# Patient Record
Sex: Female | Born: 1970 | Race: White | Hispanic: No | Marital: Married | Smoking: Never smoker
Health system: Southern US, Community
[De-identification: ages and names within clinical notes are randomized; demographics above are authoritative.]

## PROBLEM LIST (undated history)

## (undated) ENCOUNTER — Emergency Department (HOSPITAL_COMMUNITY): Discharge status: Absent without leave | Payer: 59

## (undated) DIAGNOSIS — F32A Depression, unspecified: Secondary | ICD-10-CM

## (undated) DIAGNOSIS — N183 Chronic kidney disease, stage 3 unspecified: Secondary | ICD-10-CM

## (undated) DIAGNOSIS — K219 Gastro-esophageal reflux disease without esophagitis: Secondary | ICD-10-CM

## (undated) DIAGNOSIS — T4145XA Adverse effect of unspecified anesthetic, initial encounter: Secondary | ICD-10-CM

## (undated) DIAGNOSIS — F329 Major depressive disorder, single episode, unspecified: Secondary | ICD-10-CM

## (undated) DIAGNOSIS — J45909 Unspecified asthma, uncomplicated: Secondary | ICD-10-CM

## (undated) DIAGNOSIS — M40209 Unspecified kyphosis, site unspecified: Secondary | ICD-10-CM

## (undated) DIAGNOSIS — C439 Malignant melanoma of skin, unspecified: Secondary | ICD-10-CM

## (undated) DIAGNOSIS — Z8719 Personal history of other diseases of the digestive system: Secondary | ICD-10-CM

## (undated) DIAGNOSIS — G43909 Migraine, unspecified, not intractable, without status migrainosus: Secondary | ICD-10-CM

## (undated) DIAGNOSIS — F419 Anxiety disorder, unspecified: Secondary | ICD-10-CM

## (undated) DIAGNOSIS — R296 Repeated falls: Secondary | ICD-10-CM

## (undated) HISTORY — PX: CARPAL TUNNEL RELEASE: SHX101

## (undated) HISTORY — DX: Unspecified asthma, uncomplicated: J45.909

## (undated) HISTORY — DX: Chronic kidney disease, stage 3 unspecified: N18.30

## (undated) HISTORY — PX: TONSILLECTOMY: SUR1361

## (undated) HISTORY — DX: Unspecified kyphosis, site unspecified: M40.209

## (undated) HISTORY — DX: Migraine, unspecified, not intractable, without status migrainosus: G43.909

## (undated) HISTORY — DX: Repeated falls: R29.6

## (undated) HISTORY — PX: PARTIAL HYSTERECTOMY: SHX80

## (undated) HISTORY — PX: TUBAL LIGATION: SHX77

---

## 1997-12-02 ENCOUNTER — Inpatient Hospital Stay (HOSPITAL_COMMUNITY): Admission: AD | Admit: 1997-12-02 | Discharge: 1997-12-04 | Payer: Self-pay | Admitting: Obstetrics and Gynecology

## 1999-05-29 ENCOUNTER — Other Ambulatory Visit: Admission: RE | Admit: 1999-05-29 | Discharge: 1999-05-29 | Payer: Self-pay | Admitting: Obstetrics and Gynecology

## 2000-06-22 ENCOUNTER — Other Ambulatory Visit: Admission: RE | Admit: 2000-06-22 | Discharge: 2000-06-22 | Payer: Self-pay | Admitting: Obstetrics and Gynecology

## 2001-07-04 ENCOUNTER — Other Ambulatory Visit: Admission: RE | Admit: 2001-07-04 | Discharge: 2001-07-04 | Payer: Self-pay | Admitting: Obstetrics and Gynecology

## 2002-07-30 ENCOUNTER — Other Ambulatory Visit: Admission: RE | Admit: 2002-07-30 | Discharge: 2002-07-30 | Payer: Self-pay | Admitting: Obstetrics and Gynecology

## 2003-09-03 ENCOUNTER — Other Ambulatory Visit: Admission: RE | Admit: 2003-09-03 | Discharge: 2003-09-03 | Payer: Self-pay | Admitting: Obstetrics and Gynecology

## 2004-12-09 ENCOUNTER — Other Ambulatory Visit: Admission: RE | Admit: 2004-12-09 | Discharge: 2004-12-09 | Payer: Self-pay | Admitting: Obstetrics and Gynecology

## 2005-06-15 ENCOUNTER — Inpatient Hospital Stay (HOSPITAL_COMMUNITY): Admission: AD | Admit: 2005-06-15 | Discharge: 2005-06-15 | Payer: Self-pay | Admitting: Obstetrics and Gynecology

## 2005-07-28 ENCOUNTER — Observation Stay (HOSPITAL_COMMUNITY): Admission: AD | Admit: 2005-07-28 | Discharge: 2005-07-29 | Payer: Self-pay | Admitting: Obstetrics and Gynecology

## 2005-08-05 ENCOUNTER — Inpatient Hospital Stay (HOSPITAL_COMMUNITY): Admission: AD | Admit: 2005-08-05 | Discharge: 2005-08-07 | Payer: Self-pay | Admitting: Obstetrics and Gynecology

## 2005-08-06 ENCOUNTER — Encounter (INDEPENDENT_AMBULATORY_CARE_PROVIDER_SITE_OTHER): Payer: Self-pay | Admitting: Specialist

## 2005-09-13 ENCOUNTER — Other Ambulatory Visit: Admission: RE | Admit: 2005-09-13 | Discharge: 2005-09-13 | Payer: Self-pay | Admitting: Obstetrics and Gynecology

## 2007-10-02 ENCOUNTER — Emergency Department (HOSPITAL_COMMUNITY): Admission: EM | Admit: 2007-10-02 | Discharge: 2007-10-02 | Payer: Self-pay | Admitting: Family Medicine

## 2007-10-02 ENCOUNTER — Ambulatory Visit (HOSPITAL_COMMUNITY): Admission: RE | Admit: 2007-10-02 | Discharge: 2007-10-02 | Payer: Self-pay | Admitting: Family Medicine

## 2008-08-23 DIAGNOSIS — T8859XA Other complications of anesthesia, initial encounter: Secondary | ICD-10-CM

## 2008-08-23 DIAGNOSIS — C439 Malignant melanoma of skin, unspecified: Secondary | ICD-10-CM

## 2008-08-23 HISTORY — DX: Other complications of anesthesia, initial encounter: T88.59XA

## 2008-08-23 HISTORY — DX: Malignant melanoma of skin, unspecified: C43.9

## 2008-12-12 ENCOUNTER — Ambulatory Visit (HOSPITAL_COMMUNITY): Admission: RE | Admit: 2008-12-12 | Discharge: 2008-12-13 | Payer: Self-pay | Admitting: Obstetrics and Gynecology

## 2008-12-12 ENCOUNTER — Encounter (INDEPENDENT_AMBULATORY_CARE_PROVIDER_SITE_OTHER): Payer: Self-pay | Admitting: Obstetrics and Gynecology

## 2009-04-10 ENCOUNTER — Ambulatory Visit (HOSPITAL_COMMUNITY): Admission: RE | Admit: 2009-04-10 | Discharge: 2009-04-10 | Payer: Self-pay | Admitting: Family Medicine

## 2010-09-19 ENCOUNTER — Encounter
Admission: RE | Admit: 2010-09-19 | Discharge: 2010-09-19 | Payer: Self-pay | Source: Home / Self Care | Attending: Family Medicine | Admitting: Family Medicine

## 2010-10-12 ENCOUNTER — Other Ambulatory Visit: Payer: Self-pay | Admitting: Obstetrics and Gynecology

## 2010-12-02 LAB — TYPE AND SCREEN: Antibody Screen: NEGATIVE

## 2010-12-02 LAB — CBC
HCT: 36.9 % (ref 36.0–46.0)
Hemoglobin: 12.7 g/dL (ref 12.0–15.0)
Hemoglobin: 9.2 g/dL — ABNORMAL LOW (ref 12.0–15.0)
MCV: 85.9 fL (ref 78.0–100.0)
RBC: 3.1 MIL/uL — ABNORMAL LOW (ref 3.87–5.11)
RDW: 14.2 % (ref 11.5–15.5)
WBC: 6 10*3/uL (ref 4.0–10.5)
WBC: 7.5 10*3/uL (ref 4.0–10.5)

## 2011-01-05 NOTE — H&P (Signed)
NAME:  Dana [name], Dana [name]               ACCOUNT NO.:  [id number]   MEDICAL RECORD NO.:  [id number]          PATIENT TYPE:  AMB   LOCATION:  SDC                           FACILITY:  [facility]   PHYSICIAN:  Duke Salvia. Marcelle Overlie, M.D.DATE OF BIRTH:  02-13-1971   DATE OF ADMISSION:  DATE OF DISCHARGE:                              HISTORY & PHYSICAL   DATE OF SCHEDULED SURGERY:  December 12, 2008.   CHIEF COMPLAINT:  Persistent menorrhagia, status post endometrial  ablation.   HISTORY OF PRESENT ILLNESS:  A 40 year old G2, P1, prior tubal.  This  patient underwent ThermaChoice CMA, initially had some improvement but  continues to have significant menorrhagia, presents now for [facility].  This  procedure including risks related to bleeding, infection, adjacent organ  injury, the possible need to complete the surgery by open technique  reviewed, along with her expected recovery time, other risks related to  transfusion, wound infection, phlebitis discussed also.   PAST MEDICAL HISTORY:   ALLERGIES:  PENICILLIN.   CURRENT MEDICATIONS:  Wellbutrin 300 mg daily.  Cymbalta 30 mg daily.   OBSTETRICAL HISTORY:  One vaginal delivery in 1999.  She has had a tubal  ligation in 2006, carpal tunnel surgery in 2003.   FAMILY HISTORY:  Significant for peptic ulcer disease, anemia,  arthritis, and UTI.   SOCIAL HISTORY:  Denies drug or tobacco use.  She consumes 1-2 alcoholic  drinks per week.  She is married.   PHYSICAL EXAMINATION:  VITAL SIGNS:  Temp 98.2, blood pressure 118/78.  HEENT: Unremarkable.  NECK:  Supple without masses.  LUNGS:  Clear.  CARDIOVASCULAR:  Rate and rhythm without murmurs, rubs, or gallops.  BREASTS:  Without masses.  ABDOMEN:  Soft, flat, nontender.  Pelvic exam; normal external hair.  Vagina and cervix clear.  Uterus midposition, normal size, mobile.  Adnexa negative.  EXTREMITIES:  Unremarkable.  NEUROLOGIC:  Unremarkable.   IMPRESSION:  Persistent menorrhagia status post  ThermaChoice EMA.   PLAN:  TLH procedure and risks reviewed as above.      Richard M. Marcelle Overlie, M.D.  Electronically Signed     RMH/MEDQ  D:  12/06/2008  T:  12/07/2008  Job:  [id number]

## 2011-01-05 NOTE — Op Note (Signed)
NAME:  Dana [name], Dana [name]               ACCOUNT NO.:  [id number]   MEDICAL RECORD NO.:  [id number]          PATIENT TYPE:  OIB   LOCATION:  9303                          [facility]ACILITY:  [facility]   PHYSICIAN:  Duke Salvia. Marcelle Overlie, M.D.DATE OF BIRTH:  1971-04-16   DATE OF PROCEDURE:  12/12/2008  DATE OF DISCHARGE:                               OPERATIVE REPORT   PREOPERATIVE DIAGNOSES:  Persistent menorrhagia status post endometrial  ablation, possible adenomyosis.   PROCEDURE:  Total laparoscopic hysterectomy.   SURGEON:  Duke Salvia. Marcelle Overlie, MD   ANESTHESIA:  General endotracheal.   COMPLICATIONS:  None.   DRAINS:  Foley catheter.   BLOOD LOSS:  100 mL.   PROCEDURE AND FINDINGS:  The patient was taken to the operating room.  After an adequate level of general anesthesia was obtained and the  patient's legs in stirrups, the abdomen, perineum, and vagina were  prepped and draped in usual manner for laparoscopic procedures.  Foley  catheter positioned, draining clear urine.  A sponge stick was placed in  the vagina for later manipulation.  Attention directed to the abdomen  where the subumbilical area was infiltrated with 0.25% Marcaine plain.  A small incision was made, the Veress needle was introduced without  difficulty; its intra-abdominal position verified by pressure and water  testing.  After 2.5 L pneumoperitoneum was then created up, laparoscopic  trocar and sleeve were then introduced without difficulty.  The patient  was placed in slight Trendelenburg, 2 lower incisions, a 8-mm blade was  on the left and a 5 mm on the right, I replaced in the right and left  lower quadrant just medial to the iliac crest after negative  transillumination.  The pelvic findings as follows.   The uterus itself was normal size, mobile, adnexa unremarkable.  The  assistant putting the uterus on traction upwards into the midline.  The  left utero-ovarian pedicle was coagulated and divided with the  instill  instrument down to and including the round ligament.  The loose  peritoneal tissue anteriorly was carried around anteriorly, exact same  repeated on the opposite side.  Once this was completed with minimal  blunt dissection, the bladder was dissected below.  The ascending branch  of the uterine artery on each side was coagulated and divided with the  Enseal.  One additional pedicle on each side was then taken.  The  surgeon's hand was then used to elevate the vaginal sponge stick,  identifying the anterior fornix with sharp upper traction of the uterus  further minimal blunt dissection of the bladder below.  The Harmonic Ace  was then used to cut down to perform anterior colpotomy to the sponge  could be visualized.  A sponge was dispositioned with sharp upper  pressure post in the posterior fornix with the assistant putting the  uterus on traction upward to further identify the posterior fornix.  Once the site was located, harmonic was then used to perform posterior  colpotomy.  At that point, the Harmonic Ace was then used to dissect the  remaining pedicles by coagulation and division to connect  the anterior  and posterior colpotomy and the specimen was excised.  This was removed  vaginally with towel was used to occlude the vagina to prevent escape of  the pneumoperitoneum.  A 0 Vicryl sutures through-and-through with deep  bites were then placed interrupted closing the vagina with excellent  hemostasis.  This was approximately 5-6 sutures.  The operative site was  irrigated  with saline, aspirated, reduced pressure noted be hemostatic.  Interceed  was placed across the vaginal cuff, instruments were removed, gas  allowed to escape, defects closed with 4 Dexon subcuticular sutures and  Dermabond.  She tolerated this well.  Clear urine was noted at the end  of the case.      Richard M. Marcelle Overlie, M.D.  Electronically Signed     RMH/MEDQ  D:  12/12/2008  T:  12/12/2008   Job:  [id number]

## 2011-01-05 NOTE — Discharge Summary (Signed)
NAME:  Dana [name], Dana [name]               ACCOUNT NO.:  [id number]   MEDICAL RECORD NO.:  [id number]          PATIENT TYPE:  OIB   LOCATION:  9303                          [facility]ILITY:  [facility]   PHYSICIAN:  Duke Salvia. Marcelle Overlie, M.D.DATE OF BIRTH:  1970-11-11   DATE OF ADMISSION:  12/12/2008  DATE OF DISCHARGE:  12/13/2008                               DISCHARGE SUMMARY   DISCHARGE DIAGNOSES:  1. Persistent menorrhagia, status post endometrial ablation.  2. Total laparoscopic hysterectomy this admission.   Summary of the history and physical exam, please see admission H and P  for details.  Briefly, a 40 year old who has had a prior endometrial  ablation with persistent menorrhagia presents for TLH.   HOSPITAL COURSE:  On December 12, 2008, under general anesthesia, the  patient underwent TLH with an EBL of 100 mL.  On the first postoperative  day, the catheter was removed.  Her abdominal incisions were  unremarkable, afebrile, tolerating a regular diet, ready for discharge  at that point.  CBC postop is pending at this dictation, will be checked  before discharge.   LABORATORY DATA:  Blood type is O positive.  CBC preop, WBC 6.0,  hemoglobin 12.7, hematocrit 36.9, and platelets 234,000.  UPT was  negative.  Blood type is O positive.  Antibody screen was negative.  Postop CBC pending.   DISPOSITION:  The patient is discharged on Tylox 1-2 p.o. q.4-6 p.r.n.  pain, Motrin 800 mg 1 p.o. q.8-12 h. p.r.n. pain.  She will return to  the office in 1 week.  Advised to report any incisional redness or  drainage, increased pain or bleeding, or fever over 101.  She was given  specific instructions regarding diet, sex, and exercise.   CONDITION:  Good.   ACTIVITY:  Graded increase.      Richard M. Marcelle Overlie, M.D.  Electronically Signed     RMH/MEDQ  D:  12/13/2008  T:  12/14/2008  Job:  [id number]

## 2011-01-08 NOTE — Op Note (Signed)
Dana [name], Dana [name]               ACCOUNT NO.:  [id number]   MEDICAL RECORD NO.:  [id number]          PATIENT TYPE:  INP   LOCATION:  9146                          [facility]ILITY:  [facility]   PHYSICIAN:  Guy Sandifer. Henderson Cloud, M.D. DATE OF BIRTH:  1971/05/01   DATE OF PROCEDURE:  08/06/2005  DATE OF DISCHARGE:  08/07/2005                                 OPERATIVE REPORT   PREOPERATIVE DIAGNOSIS:  Multiparity, desires permanent sterilization.   POSTOPERATIVE DIAGNOSIS:  Multiparity, desires permanent sterilization.   PROCEDURE:  Postpartum tubal ligation.   SURGEON:  Harold Hedge, MD   ANESTHESIA:  Epidural.   ESTIMATED BLOOD LOSS:  Drops.   SPECIMENS:  Segments of bilateral fallopian tubes.   INDICATIONS AND CONSENT:  This patient is a 40 year old married white  female, G4, P2, status post vaginal delivery of a healthy child last  evening.  She desires permanent sterilization.  Permanence of the procedure,  failure rate, and increased ectopic risks as well as potential complications  of infection, bleeding with transfusion with HIV and hepatitis risks, organ  damage, DVT, PE, and pneumonia have been reviewed.  All questions have been  answered, and consent is signed on the chart.   PROCEDURE:  The patient is taken to operating room where she is identified;  epidural anesthetic is augmented to a surgical level, and she is placed in  the dorsosupine position.  She is prepped and draped in a sterile fashion.  Initially, she has a light pinching sensation when the skin is grasped with  Allis clamps.  Her epidural is augmented, and the infraumbilical area is  infiltrated with 0.5% plain Marcaine.  A small infraumbilical incision is  then made, and dissection is carried out in the peritoneal cavity without  difficulty.  The right fallopian tube is identified from cornu to fimbria.  It is grasped in its mid ampullary portion, and a knuckle of tube is doubly  ligated with 2 free ties of 0 plain  suture.  The intervening knuckle of tube  is then sharply resected.  Cautery is used to assure complete hemostasis.  Similar procedure is carried out on the left side.  After assuring  hemostasis, the fascia is closed in a running fashion with a 0 Vicryl  suture.  The skin is closed with a subcuticular 2-0 Vicryl suture.  Dermabond is applied; pressure dressing is applied.  All counts are correct.  The patient is taken to the recovery room in stable condition.      Guy Sandifer Henderson Cloud, M.D.  Electronically Signed    JET/MEDQ  D:  08/06/2005  T:  08/07/2005  Job:  [id number]

## 2011-05-14 LAB — POCT URINALYSIS DIP (DEVICE)
Bilirubin Urine: NEGATIVE
Glucose, UA: NEGATIVE
Ketones, ur: NEGATIVE
Nitrite: NEGATIVE

## 2011-05-14 LAB — POCT H PYLORI SCREEN: H. PYLORI SCREEN, POC: NEGATIVE

## 2011-05-14 LAB — POCT PREGNANCY, URINE
Operator id: 116391
Preg Test, Ur: NEGATIVE

## 2011-05-29 ENCOUNTER — Encounter: Payer: Self-pay | Admitting: *Deleted

## 2011-05-29 ENCOUNTER — Emergency Department (HOSPITAL_BASED_OUTPATIENT_CLINIC_OR_DEPARTMENT_OTHER)
Admission: EM | Admit: 2011-05-29 | Discharge: 2011-05-29 | Disposition: A | Discharge status: Home / Self Care | Payer: 59 | Attending: Emergency Medicine | Admitting: Emergency Medicine

## 2011-05-29 ENCOUNTER — Emergency Department (INDEPENDENT_AMBULATORY_CARE_PROVIDER_SITE_OTHER): Payer: 59

## 2011-05-29 DIAGNOSIS — M79609 Pain in unspecified limb: Secondary | ICD-10-CM

## 2011-05-29 DIAGNOSIS — W208XXA Other cause of strike by thrown, projected or falling object, initial encounter: Secondary | ICD-10-CM | POA: Insufficient documentation

## 2011-05-29 DIAGNOSIS — X58XXXA Exposure to other specified factors, initial encounter: Secondary | ICD-10-CM

## 2011-05-29 DIAGNOSIS — S9030XA Contusion of unspecified foot, initial encounter: Secondary | ICD-10-CM | POA: Insufficient documentation

## 2011-05-29 DIAGNOSIS — S8990XA Unspecified injury of unspecified lower leg, initial encounter: Secondary | ICD-10-CM

## 2011-05-29 HISTORY — DX: Depression, unspecified: F32.A

## 2011-05-29 HISTORY — DX: Major depressive disorder, single episode, unspecified: F32.9

## 2011-05-29 HISTORY — DX: Malignant melanoma of skin, unspecified: C43.9

## 2011-05-29 HISTORY — DX: Anxiety disorder, unspecified: F41.9

## 2011-05-29 NOTE — ED Notes (Signed)
Pt reports her 42yo son pulled a bookshelf on her foot - left foot bruised- painful to walk

## 2011-05-29 NOTE — ED Provider Notes (Signed)
History  Scribed for Dr. Judd Lien, the patient was seen in room [facility] The chart was scribed by Gilman Schmidt. The patients care was started at 2103.  CSN: [id number] Arrival date & time: 05/29/2011  8:31 PM  Chief Complaint  Patient presents with  . Foot Injury    HPI Dana [name] is a 40 y.o. female who presents to the Emergency Department complaining of left foot injury. Pt reports her 5yo son pulled a bookshelf on her foot today. States it is painful to walk. Additionally notes a tingling feeling in foot. There are no other associated symptoms and no other alleviating or aggravating factors.   PAST MEDICAL HISTORY:  Past Medical History  Diagnosis Date  . Melanoma   . Depression   . Anxiety      PAST SURGICAL HISTORY:  Past Surgical History  Procedure Date  . Carpal tunnel release   . Partial hysterectomy      MEDICATIONS:  Previous Medications   DESVENLAFAXINE (PRISTIQ) 50 MG 24 HR TABLET    Take 50 mg by mouth daily.       ALLERGIES:  Allergies as of 05/29/2011 - Review Complete 05/29/2011  Allergen Reaction Noted  . Penicillins Rash 05/29/2011     FAMILY HISTORY:  No family history on file.   SOCIAL HISTORY: History  Substance Use Topics  . Smoking status: Never Smoker   . Smokeless tobacco: Not on file  . Alcohol Use: Yes     rare     Review of Systems  Musculoskeletal:       Foot pain  Skin:       Bruising  All other systems reviewed and are negative.    Allergies  Penicillins  Home Medications   Current Outpatient Rx  Name Route Sig Dispense Refill  . DESVENLAFAXINE SUCCINATE 50 MG PO TB24 Oral Take 50 mg by mouth daily.        BP 116/70  Pulse 88  Temp(Src) 98.3 F (36.8 C) (Oral)  Resp 18  Ht 5\' 2"  (1.575 m)  Wt 134 lb (60.782 kg)  BMI 24.51 kg/m2  SpO2 100%  Physical Exam  Constitutional: She is oriented to person, place, and time. She appears well-developed and well-nourished.  HENT:  Head: Normocephalic and atraumatic.    Right Ear: External ear normal.  Left Ear: External ear normal.  Eyes: Conjunctivae and EOM are normal. Pupils are equal, round, and reactive to light.  Neck: Normal range of motion and phonation normal. Neck supple.  Cardiovascular: Normal rate, regular rhythm, normal heart sounds and intact distal pulses.   Pulmonary/Chest: Effort normal and breath sounds normal. She exhibits no bony tenderness.  Abdominal: Soft. Normal appearance. There is no tenderness.  Musculoskeletal: Normal range of motion.  Neurological: She is alert and oriented to person, place, and time. She has normal strength. No cranial nerve deficit or sensory deficit. She exhibits normal muscle tone. Coordination normal.  Skin: Skin is warm, dry and intact.       3-4cm ecchymosis around top of mid left foot  Psychiatric: She has a normal mood and affect. Her behavior is normal. Judgment and thought content normal.    ED Course  Procedures  OTHER DATA REVIEWED: Nursing notes, vital signs, and past medical records reviewed.   DIAGNOSTIC STUDIES: Oxygen Saturation is 100% on room air, normal by my interpretation.    RADIOLOGY:  Dg Foot Complete Left 05/29/2011  *RADIOLOGY REPORT*  Clinical Data: Foot injury, pain.  LEFT FOOT -  COMPLETE 3+ VIEW  Comparison: None  Findings: Degenerative changes in the left first MTP joint.  Soft tissue swelling over the dorsum of the foot.  No fracture, subluxation or dislocation.  IMPRESSION: No acute bony abnormality.  Original Report Authenticated By: Cyndie Chime, M.D.   MDM: Xrays okay, looks to be a bruise.  Will discharge with ice, time.  IMPRESSION: Diagnoses that have been ruled out:  Diagnoses that are still under consideration:  Final diagnoses:  Foot contusion    PLAN:  Home The patient is to return the emergency department if there is any worsening of symptoms. I have reviewed the discharge instructions with the patient.  CONDITION ON  DISCHARGE: Stable  MEDICATIONS GIVEN IN THE E.D.  Medications  desvenlafaxine (PRISTIQ) 50 MG 24 hr tablet (not administered)    DISCHARGE MEDICATIONS: New Prescriptions   No medications on file    SCRIBE ATTESTATION: I personally performed the services described in this documentation, which was scribed in my presence. The recorded information has been reviewed and considered.            Geoffery Lyons, MD 05/30/11 2035

## 2011-07-19 ENCOUNTER — Other Ambulatory Visit: Payer: Self-pay | Admitting: Dermatology

## 2012-01-03 ENCOUNTER — Other Ambulatory Visit: Payer: Self-pay | Admitting: Obstetrics and Gynecology

## 2012-01-03 DIAGNOSIS — R928 Other abnormal and inconclusive findings on diagnostic imaging of breast: Secondary | ICD-10-CM

## 2012-01-06 ENCOUNTER — Other Ambulatory Visit: Payer: Self-pay | Admitting: Obstetrics and Gynecology

## 2012-01-06 ENCOUNTER — Ambulatory Visit
Admission: RE | Admit: 2012-01-06 | Discharge: 2012-01-06 | Disposition: A | Discharge status: Home / Self Care | Payer: 59 | Source: Ambulatory Visit | Attending: Obstetrics and Gynecology | Admitting: Obstetrics and Gynecology

## 2012-01-06 DIAGNOSIS — R928 Other abnormal and inconclusive findings on diagnostic imaging of breast: Secondary | ICD-10-CM

## 2012-01-12 ENCOUNTER — Ambulatory Visit
Admission: RE | Admit: 2012-01-12 | Discharge: 2012-01-12 | Disposition: A | Discharge status: Home / Self Care | Payer: 59 | Source: Ambulatory Visit | Attending: Obstetrics and Gynecology | Admitting: Obstetrics and Gynecology

## 2012-01-12 ENCOUNTER — Other Ambulatory Visit: Payer: Self-pay | Admitting: Obstetrics and Gynecology

## 2012-01-12 DIAGNOSIS — R928 Other abnormal and inconclusive findings on diagnostic imaging of breast: Secondary | ICD-10-CM

## 2012-02-13 ENCOUNTER — Encounter (HOSPITAL_COMMUNITY): Payer: Self-pay | Admitting: Pharmacist

## 2012-02-14 NOTE — H&P (Signed)
NAMEMarland [name]  CARLING, LIBERMAN NO.:  [id number]  MEDICAL RECORD NO.:  [id number]  LOCATION:  PERIO                         [facility]ACILITY:  [facility]  PHYSICIAN:  Duke Salvia. Marcelle Overlie, M.D.DATE OF BIRTH:  12/16/1970  DATE OF ADMISSION:  02/03/2012 DATE OF DISCHARGE:                             HISTORY & PHYSICAL   CHIEF COMPLAINT:  Stress urinary incontinence.  HISTORY OF PRESENT ILLNESS:  A 41 year old G2, P2.  This patient has had a prior TLH.  In 2012, she had BAIN, but vaginal cuff biopsy showed no dysplasia.  She has had worsening urinary leakage problems and recently underwent urodynamics that showed PVR less than 45 mL with a normal UA. She did leak with a full bladder.  Further evaluation showed LPP 77 and 108 with MUCP 35 and 33 with a normal uroflow.  We discussed options for treatment and she prefers to proceed with outpatient single incision sling.  The procedure including risks related to bleeding, infection, adjacent organ injury, I reviewed the specific concerns regarding polypropylene mesh and the 3-5% risk of mesh erosion and how the complication is treated discussed with her.  The procedure including the anesthesia along with cystoscopy at that time of the procedure and followup afterwards reviewed with her also.  PAST MEDICAL HISTORY:  ALLERGIES:  PENICILLIN.  CURRENT MEDICATIONS:  Pristiq and Abilify.  OBSTETRICAL HISTORY:  Two vaginal deliveries.  Tubal ligation in 2006, TLH in 2007.  REVIEW OF SYSTEMS:  Significant for BAIN with subsequent negative biopsy, history of headache, peptic ulcer disease, UTI.  FAMILY HISTORY:  Significant for headache, hiatal hernia, anemia, diverticulosis, and arthritis.  SOCIAL HISTORY:  Denies tobacco or drug use.  She does drink socially. She is married.  Dr. Laurann Montana is her medical doctor.  PHYSICAL EXAMINATION:  VITAL SIGNS:  Temp 98.2, BP 120/70. HEENT:  Unremarkable. NECK:  Supple without masses. LUNGS:   Clear. CARDIOVASCULAR:  Regular rate and rhythm without murmurs, rubs, or gallops. BREASTS:  Without masses. ABDOMEN:  Soft, flat, nontender. PELVIC:  Normal external genitalia.  The vaginal cuff unremarkable. Bimanual negative. EXTREMITIES:  Unremarkable. NEUROLOGIC:  Unremarkable.  IMPRESSION:  Stress urinary incontinence.  PLAN:  Solyx single incision sling.  Procedure and risks discussed as above.     Mariafernanda Hendricksen M. Marcelle Overlie, M.D.     RMH/MEDQ  D:  02/14/2012  T:  02/14/2012  Job:  [id number]

## 2012-02-14 NOTE — Progress Notes (Signed)
ROSALYNN SERGENT  DICTATION # [id number] CSN# [id number]   Meriel Pica, MD 02/14/2012 3:01 PM

## 2012-02-16 ENCOUNTER — Encounter (HOSPITAL_COMMUNITY): Payer: Self-pay | Admitting: Pharmacy Technician

## 2012-02-18 ENCOUNTER — Encounter (HOSPITAL_COMMUNITY)
Admission: RE | Admit: 2012-02-18 | Discharge: 2012-02-18 | Disposition: A | Discharge status: Home / Self Care | Payer: 59 | Source: Ambulatory Visit | Attending: Obstetrics and Gynecology | Admitting: Obstetrics and Gynecology

## 2012-02-18 ENCOUNTER — Encounter (HOSPITAL_COMMUNITY): Payer: Self-pay

## 2012-02-18 HISTORY — DX: Adverse effect of unspecified anesthetic, initial encounter: T41.45XA

## 2012-02-18 HISTORY — DX: Gastro-esophageal reflux disease without esophagitis: K21.9

## 2012-02-18 HISTORY — DX: Personal history of other diseases of the digestive system: Z87.19

## 2012-02-18 LAB — CBC
HCT: 37.5 % (ref 36.0–46.0)
Hemoglobin: 13 g/dL (ref 12.0–15.0)
RBC: 4.31 MIL/uL (ref 3.87–5.11)
WBC: 6.7 10*3/uL (ref 4.0–10.5)

## 2012-02-18 NOTE — Patient Instructions (Addendum)
YOUR PROCEDURE IS SCHEDULED ON:02/25/12  ENTER THROUGH THE MAIN ENTRANCE OF [facility] AT:6am  USE DESK PHONE AND DIAL [phone] TO INFORM us OF YOUR ARRIVAL  CALL [phone] IF YOU HAVE ANY QUESTIONS OR PROBLEMS PRIOR TO YOUR ARRIVAL.  REMEMBER: DO NOT EAT OR DRINK AFTER MIDNIGHT :Thursday    YOU MAY BRUSH YOUR TEETH THE MORNING OF SURGERY   TAKE THESE MEDICINES THE DAY OF SURGERY WITH SIP OF WATER:none   DO NOT WEAR JEWELRY, EYE MAKEUP, LIPSTICK OR DARK FINGERNAIL POLISH DO NOT WEAR LOTIONS  DO NOT SHAVE FOR 48 HOURS PRIOR TO SURGERY  YOU WILL NOT BE ALLOWED TO DRIVE YOURSELF HOME.- Driver: Maurine Minister

## 2012-02-25 ENCOUNTER — Ambulatory Visit (HOSPITAL_COMMUNITY)
Admission: RE | Admit: 2012-02-25 | Discharge: 2012-02-25 | Disposition: A | Discharge status: Home / Self Care | Payer: 59 | Source: Ambulatory Visit | Attending: Obstetrics and Gynecology | Admitting: Obstetrics and Gynecology

## 2012-02-25 ENCOUNTER — Encounter (HOSPITAL_COMMUNITY): Payer: Self-pay | Admitting: Anesthesiology

## 2012-02-25 ENCOUNTER — Encounter (HOSPITAL_COMMUNITY)
Admission: RE | Disposition: A | Discharge status: Home / Self Care | Payer: Self-pay | Source: Ambulatory Visit | Attending: Obstetrics and Gynecology

## 2012-02-25 ENCOUNTER — Encounter (HOSPITAL_COMMUNITY): Payer: Self-pay

## 2012-02-25 ENCOUNTER — Ambulatory Visit (HOSPITAL_COMMUNITY): Payer: 59 | Admitting: Anesthesiology

## 2012-02-25 DIAGNOSIS — Z01812 Encounter for preprocedural laboratory examination: Secondary | ICD-10-CM | POA: Insufficient documentation

## 2012-02-25 DIAGNOSIS — Z01818 Encounter for other preprocedural examination: Secondary | ICD-10-CM | POA: Insufficient documentation

## 2012-02-25 DIAGNOSIS — N393 Stress incontinence (female) (male): Secondary | ICD-10-CM

## 2012-02-25 HISTORY — PX: CYSTOSCOPY: SHX5120

## 2012-02-25 HISTORY — PX: PUBOVAGINAL SLING: SHX1035

## 2012-02-25 SURGERY — CREATION, PUBOVAGINAL SLING
Anesthesia: General | Site: Vagina | Wound class: Clean Contaminated

## 2012-02-25 MED ORDER — PROPOFOL 10 MG/ML IV EMUL
INTRAVENOUS | Status: AC
  Filled 2012-02-25: qty 20

## 2012-02-25 MED ORDER — PANTOPRAZOLE SODIUM 40 MG PO TBEC
40.0000 mg | DELAYED_RELEASE_TABLET | Freq: Once | ORAL | Status: AC | PRN
  Administered 2012-02-25: 40 mg via ORAL

## 2012-02-25 MED ORDER — MIDAZOLAM HCL 5 MG/5ML IJ SOLN
INTRAMUSCULAR | Status: DC | PRN
  Administered 2012-02-25: 2 mg via INTRAVENOUS

## 2012-02-25 MED ORDER — STERILE WATER FOR IRRIGATION IR SOLN
Status: DC | PRN
  Administered 2012-02-25: 1000 mL via INTRAVESICAL

## 2012-02-25 MED ORDER — DEXAMETHASONE SODIUM PHOSPHATE 4 MG/ML IJ SOLN
INTRAMUSCULAR | Status: DC | PRN
  Administered 2012-02-25: 10 mg via INTRAVENOUS

## 2012-02-25 MED ORDER — KETOROLAC TROMETHAMINE 30 MG/ML IJ SOLN
INTRAMUSCULAR | Status: AC
  Filled 2012-02-25: qty 1

## 2012-02-25 MED ORDER — ONDANSETRON HCL 4 MG/2ML IJ SOLN
INTRAMUSCULAR | Status: AC
  Filled 2012-02-25: qty 2

## 2012-02-25 MED ORDER — KETOROLAC TROMETHAMINE 30 MG/ML IJ SOLN: 15.0000 mg | Freq: Once | INTRAMUSCULAR | Status: DC | PRN

## 2012-02-25 MED ORDER — MIDAZOLAM HCL 2 MG/2ML IJ SOLN
INTRAMUSCULAR | Status: AC
  Filled 2012-02-25: qty 2

## 2012-02-25 MED ORDER — LIDOCAINE-EPINEPHRINE 1 %-1:100000 IJ SOLN
INTRAMUSCULAR | Status: DC | PRN
  Administered 2012-02-25: 6 mL

## 2012-02-25 MED ORDER — GLYCOPYRROLATE 0.2 MG/ML IJ SOLN
INTRAMUSCULAR | Status: AC
  Filled 2012-02-25: qty 1

## 2012-02-25 MED ORDER — DEXAMETHASONE SODIUM PHOSPHATE 10 MG/ML IJ SOLN
INTRAMUSCULAR | Status: AC
  Filled 2012-02-25: qty 1

## 2012-02-25 MED ORDER — IBUPROFEN 200 MG PO TABS: 800.0000 mg | ORAL_TABLET | Freq: Three times a day (TID) | ORAL | Status: AC | PRN

## 2012-02-25 MED ORDER — PANTOPRAZOLE SODIUM 40 MG PO TBEC
DELAYED_RELEASE_TABLET | ORAL | Status: AC
  Filled 2012-02-25: qty 1

## 2012-02-25 MED ORDER — CIPROFLOXACIN IN D5W 400 MG/200ML IV SOLN
400.0000 mg | INTRAVENOUS | Status: AC
  Administered 2012-02-25: 400 mg via INTRAVENOUS
  Filled 2012-02-25: qty 200

## 2012-02-25 MED ORDER — ONDANSETRON HCL 4 MG/2ML IJ SOLN
INTRAMUSCULAR | Status: DC | PRN
  Administered 2012-02-25: 4 mg via INTRAVENOUS

## 2012-02-25 MED ORDER — PROPOFOL 10 MG/ML IV EMUL
INTRAVENOUS | Status: DC | PRN
  Administered 2012-02-25: 30 mg via INTRAVENOUS
  Administered 2012-02-25: 170 mg via INTRAVENOUS

## 2012-02-25 MED ORDER — LACTATED RINGERS IV SOLN
INTRAVENOUS | Status: DC
  Administered 2012-02-25: 125 mL/h via INTRAVENOUS
  Administered 2012-02-25: 08:00:00 via INTRAVENOUS

## 2012-02-25 MED ORDER — LIDOCAINE HCL (CARDIAC) 20 MG/ML IV SOLN
INTRAVENOUS | Status: AC
  Filled 2012-02-25: qty 5

## 2012-02-25 MED ORDER — CLINDAMYCIN PHOSPHATE 900 MG/50ML IV SOLN
900.0000 mg | INTRAVENOUS | Status: AC
  Administered 2012-02-25: 900 mg via INTRAVENOUS
  Filled 2012-02-25: qty 50

## 2012-02-25 MED ORDER — FENTANYL CITRATE 0.05 MG/ML IJ SOLN
INTRAMUSCULAR | Status: AC
  Filled 2012-02-25: qty 5

## 2012-02-25 MED ORDER — LIDOCAINE HCL (CARDIAC) 20 MG/ML IV SOLN
INTRAVENOUS | Status: DC | PRN
  Administered 2012-02-25: 50 mg via INTRAVENOUS

## 2012-02-25 MED ORDER — FENTANYL CITRATE 0.05 MG/ML IJ SOLN: 25.0000 ug | INTRAMUSCULAR | Status: DC | PRN

## 2012-02-25 MED ORDER — FENTANYL CITRATE 0.05 MG/ML IJ SOLN
INTRAMUSCULAR | Status: AC
  Filled 2012-02-25: qty 2

## 2012-02-25 MED ORDER — HYDROCODONE-IBUPROFEN 7.5-200 MG PO TABS: 1.0000 | ORAL_TABLET | Freq: Three times a day (TID) | ORAL | Status: AC | PRN

## 2012-02-25 MED ORDER — GLYCOPYRROLATE 0.2 MG/ML IJ SOLN
INTRAMUSCULAR | Status: DC | PRN
  Administered 2012-02-25: 0.3 mg via INTRAVENOUS

## 2012-02-25 MED ORDER — LACTATED RINGERS IV SOLN: INTRAVENOUS | Status: DC

## 2012-02-25 MED ORDER — FENTANYL CITRATE 0.05 MG/ML IJ SOLN
INTRAMUSCULAR | Status: DC | PRN
  Administered 2012-02-25 (×3): 50 ug via INTRAVENOUS
  Administered 2012-02-25: 100 ug via INTRAVENOUS

## 2012-02-25 MED ORDER — PANTOPRAZOLE SODIUM 40 MG PO TBEC
DELAYED_RELEASE_TABLET | ORAL | Status: AC
  Administered 2012-02-25: 40 mg via ORAL
  Filled 2012-02-25: qty 1

## 2012-02-25 MED ORDER — KETOROLAC TROMETHAMINE 15 MG/ML IJ SOLN
INTRAMUSCULAR | Status: DC | PRN
  Administered 2012-02-25: 30 mg via INTRAVENOUS

## 2012-02-25 SURGICAL SUPPLY — 25 items
ADH SKN CLS APL DERMABOND .7 (GAUZE/BANDAGES/DRESSINGS) ×2
BLADE SURG 11 STRL SS (BLADE) ×3 IMPLANT
BLADE SURG 15 STRL LF C SS BP (BLADE) ×2 IMPLANT
BLADE SURG 15 STRL SS (BLADE) ×3
CANISTER SUCTION 2500CC (MISCELLANEOUS) ×3 IMPLANT
CLOTH BEACON ORANGE TIMEOUT ST (SAFETY) ×3 IMPLANT
DECANTER SPIKE VIAL GLASS SM (MISCELLANEOUS) ×5 IMPLANT
DERMABOND ADVANCED (GAUZE/BANDAGES/DRESSINGS) ×1
DERMABOND ADVANCED .7 DNX12 (GAUZE/BANDAGES/DRESSINGS) ×2 IMPLANT
GLOVE BIO SURGEON STRL SZ7 (GLOVE) ×6 IMPLANT
GOWN PREVENTION PLUS LG XLONG (DISPOSABLE) ×10 IMPLANT
NDL HYPO 25X1 1.5 SAFETY (NEEDLE) ×2 IMPLANT
NEEDLE HYPO 25X1 1.5 SAFETY (NEEDLE) ×3 IMPLANT
NS IRRIG 1000ML POUR BTL (IV SOLUTION) ×3 IMPLANT
PACK VAGINAL WOMENS (CUSTOM PROCEDURE TRAY) ×3 IMPLANT
SET CYSTO W/LG BORE CLAMP LF (SET/KITS/TRAYS/PACK) ×3 IMPLANT
SUT VIC AB 2-0 CT1 27 (SUTURE) ×3
SUT VIC AB 2-0 CT1 TAPERPNT 27 (SUTURE) IMPLANT
SUT VIC AB 2-0 SH 27 (SUTURE) ×3
SUT VIC AB 2-0 SH 27XBRD (SUTURE) IMPLANT
SUT VIC AB 2-0 UR6 27 (SUTURE) ×1 IMPLANT
Solyx SIS System (Sling) ×1 IMPLANT
TOWEL OR 17X24 6PK STRL BLUE (TOWEL DISPOSABLE) ×6 IMPLANT
TRAY FOLEY CATH 14FR (SET/KITS/TRAYS/PACK) ×3 IMPLANT
WATER STERILE IRR 1000ML POUR (IV SOLUTION) ×3 IMPLANT

## 2012-02-25 NOTE — Progress Notes (Signed)
The patient was re-examined with no change in status 

## 2012-02-25 NOTE — Op Note (Signed)
Preoperative diagnosis: Stress urinary incontinence  Postoperative diagnosis: Same  Surgeon: Marcelle Overlie  EBL: 100 cc  Specimens removed: None  Drains: Foley catheter  Complications: None  Procedure and findings:  The patient taken the operating room after an adequate level of general anesthesia was obtained with the legs in stirrups the perineum and vagina were prepped and draped with Betadine and the bladder was drained with a red rubber catheter. Appropriate timeout for taken at that point.  The mid urethral area was grasped between 2 Allis clamps, dilute Xylocaine with epinephrine was injected a vertical incision was made at the mid urethral area approximately 2 cm, finger dissection was then used to dissect this along with Metzenbaum scissors. This was done until the posterior aspect of the inferior pubic ramus could be palpated on each side. Once this was accomplished the Solix sling was first anchored at a 45 angle on the patient's right to the midpoint per protocol. Once this was Berneice Heinrich the mesh was reloaded appropriate position on the left with tensioning such that the mesh was lying flat at the mid urethral area without significant tension. After this was accomplished cystoscopy was carried out revealing the bladder and urethra to be normal. Foley catheter was in position draining clear urine. The vaginal mucosal incision was closed with interrupted 2-0 Vicryl sutures. This was hemostatic she went to PACU in good condition.  Dictated with [facility]  Genette Huertas M. Milana Obey.D.

## 2012-02-25 NOTE — Anesthesia Postprocedure Evaluation (Signed)
Anesthesia Post Note  Patient: Dana [name]  Procedure(s) Performed: Procedure(s) (LRB): PUBO-VAGINAL SLING (N/A) CYSTOSCOPY (N/A)  Anesthesia type: General  Patient location: PACU  Post pain: Pain level controlled  Post assessment: Post-op Vital signs reviewed  Last Vitals:  Filed Vitals:   02/25/12 0845  BP: 121/69  Pulse: 129  Temp:   Resp: 12    Post vital signs: Reviewed  Level of consciousness: sedated  Complications: No apparent anesthesia complications

## 2012-02-25 NOTE — Anesthesia Preprocedure Evaluation (Signed)
Anesthesia Evaluation  Patient identified by MRN, date of birth, ID band Patient awake    Reviewed: Allergy & Precautions, H&P , NPO status , Patient's Chart, lab work & pertinent test results, reviewed documented beta blocker date and time   History of Anesthesia Complications Negative for: history of anesthetic complications  Airway Mallampati: I TM Distance: >3 FB Neck ROM: full    Dental  (+) Teeth Intact   Pulmonary neg pulmonary ROS,  breath sounds clear to auscultation  Pulmonary exam normal       Cardiovascular Exercise Tolerance: Good negative cardio ROS  Rhythm:regular Rate:Normal     Neuro/Psych PSYCHIATRIC DISORDERS (anxiety/depression) negative neurological ROS     GI/Hepatic Neg liver ROS, hiatal hernia, GERD- (no meds)  ,  Endo/Other  negative endocrine ROS  Renal/GU negative Renal ROS Bladder dysfunction      Musculoskeletal   Abdominal   Peds  Hematology negative hematology ROS (+)   Anesthesia Other Findings   Reproductive/Obstetrics negative OB ROS                           Anesthesia Physical Anesthesia Plan  ASA: II  Anesthesia Plan: General LMA   Post-op Pain Management:    Induction:   Airway Management Planned:   Additional Equipment:   Intra-op Plan:   Post-operative Plan:   Informed Consent: I have reviewed the patients History and Physical, chart, labs and discussed the procedure including the risks, benefits and alternatives for the proposed anesthesia with the patient or authorized representative who has indicated his/her understanding and acceptance.   Dental Advisory Given  Plan Discussed with: CRNA and Surgeon  Anesthesia Plan Comments:         Anesthesia Quick Evaluation

## 2012-02-25 NOTE — Anesthesia Procedure Notes (Signed)
Procedure Name: LMA Insertion Date/Time: 02/25/2012 7:20 AM Performed by: Isabella Bowens R Pre-anesthesia Checklist: Patient identified, Emergency Drugs available, Suction available, Timeout performed and Patient being monitored Patient Re-evaluated:Patient Re-evaluated prior to inductionOxygen Delivery Method: Circle system utilized Preoxygenation: Pre-oxygenation with 100% oxygen Intubation Type: IV induction Ventilation: Mask ventilation without difficulty LMA: LMA inserted LMA Size: 4.0 Grade View: Grade I Number of attempts: 1 Placement Confirmation: positive ETCO2 and breath sounds checked- equal and bilateral Dental Injury: Teeth and Oropharynx as per pre-operative assessment

## 2012-02-25 NOTE — Transfer of Care (Signed)
Immediate Anesthesia Transfer of Care Note  Patient: Dana [name]  Procedure(s) Performed: Procedure(s) (LRB): PUBO-VAGINAL SLING (N/A) CYSTOSCOPY (N/A)  Patient Location: PACU  Anesthesia Type: General  Level of Consciousness: awake, alert  and oriented  Airway & Oxygen Therapy: Patient Spontanous Breathing and Patient connected to nasal cannula oxygen  Post-op Assessment: Report given to PACU RN and Post -op Vital signs reviewed and stable  Post vital signs: Reviewed and stable  Complications: No apparent anesthesia complications

## 2012-02-27 ENCOUNTER — Encounter (HOSPITAL_COMMUNITY): Payer: Self-pay | Admitting: Obstetrics and Gynecology

## 2014-07-24 ENCOUNTER — Other Ambulatory Visit: Payer: Self-pay | Admitting: Obstetrics and Gynecology

## 2014-07-25 LAB — CYTOLOGY - PAP

## 2015-12-04 ENCOUNTER — Emergency Department (HOSPITAL_BASED_OUTPATIENT_CLINIC_OR_DEPARTMENT_OTHER): Payer: 59

## 2015-12-04 ENCOUNTER — Emergency Department (HOSPITAL_BASED_OUTPATIENT_CLINIC_OR_DEPARTMENT_OTHER)
Admission: EM | Admit: 2015-12-04 | Discharge: 2015-12-05 | Disposition: A | Discharge status: Home / Self Care | Payer: 59 | Attending: Emergency Medicine | Admitting: Emergency Medicine

## 2015-12-04 ENCOUNTER — Encounter (HOSPITAL_BASED_OUTPATIENT_CLINIC_OR_DEPARTMENT_OTHER): Payer: Self-pay

## 2015-12-04 DIAGNOSIS — Y999 Unspecified external cause status: Secondary | ICD-10-CM | POA: Diagnosis not present

## 2015-12-04 DIAGNOSIS — Z8553 Personal history of malignant neoplasm of renal pelvis: Secondary | ICD-10-CM | POA: Insufficient documentation

## 2015-12-04 DIAGNOSIS — S93401A Sprain of unspecified ligament of right ankle, initial encounter: Secondary | ICD-10-CM | POA: Insufficient documentation

## 2015-12-04 DIAGNOSIS — F329 Major depressive disorder, single episode, unspecified: Secondary | ICD-10-CM | POA: Diagnosis not present

## 2015-12-04 DIAGNOSIS — Y939 Activity, unspecified: Secondary | ICD-10-CM | POA: Diagnosis not present

## 2015-12-04 DIAGNOSIS — W109XXA Fall (on) (from) unspecified stairs and steps, initial encounter: Secondary | ICD-10-CM | POA: Insufficient documentation

## 2015-12-04 DIAGNOSIS — Y929 Unspecified place or not applicable: Secondary | ICD-10-CM | POA: Insufficient documentation

## 2015-12-04 DIAGNOSIS — S99911A Unspecified injury of right ankle, initial encounter: Secondary | ICD-10-CM | POA: Diagnosis present

## 2015-12-04 MED ORDER — NAPROXEN 500 MG PO TABS: 500.0000 mg | ORAL_TABLET | Freq: Two times a day (BID) | ORAL | Status: DC

## 2015-12-04 MED ORDER — IBUPROFEN 800 MG PO TABS
800.0000 mg | ORAL_TABLET | Freq: Once | ORAL | Status: AC
  Administered 2015-12-04: 800 mg via ORAL
  Filled 2015-12-04: qty 1

## 2015-12-04 NOTE — ED Notes (Signed)
Pt fell down approximately three steps and rolled right ankle, swelling and pain to area, unable to bear weight

## 2015-12-04 NOTE — ED Notes (Signed)
PA at bedside.

## 2015-12-04 NOTE — ED Provider Notes (Signed)
CSN: [id number]     Arrival date & time 12/04/15  2140 History   First MD Initiated Contact with Patient 12/04/15 2321     Chief Complaint  Patient presents with  . Ankle Injury     (Consider location/radiation/quality/duration/timing/severity/associated sxs/prior Treatment) HPI Comments: Patient presents today with right ankle pain.  Pain has been present since she tripped and fell down three stairs approximately one hour prior to arrival.  She states that she twisted her right ankle when she fell and then landed on her left side.  She denies hitting her head or LOC.  Denies neck pain or back pain.  Denies knee or hip pain.  Denies any pain of the LLE.  No numbness or tingling of the foot.  Ankle pain and swelling located over the lateral malleolus.  She has not taken anything for pain prior to arrival.  She was given Ibuprofen for the pain upon arrival in the ED, which she states has helped.  She has not been able to bear weight since the injury.  The history is provided by the patient.    Past Medical History  Diagnosis Date  . Depression   . Anxiety   . Melanoma ([place]) 2010    left hip  . GERD (gastroesophageal reflux disease)     no meds  . H/O hiatal hernia   . Complication of anesthesia 2010    hard to wake up after colonoscopy, and increased HR   Past Surgical History  Procedure Laterality Date  . Carpal tunnel release    . Partial hysterectomy    . Pubovaginal sling  02/25/2012    Procedure: Gaynelle Arabian;  Surgeon: Margarette Asal, MD;  Location: [facility];  Service: Gynecology;  Laterality: N/A;  . Cystoscopy  02/25/2012    Procedure: CYSTOSCOPY;  Surgeon: Margarette Asal, MD;  Location: [facility];  Service: Gynecology;  Laterality: N/A;   No family history on file. Social History  Substance Use Topics  . Smoking status: Never Smoker   . Smokeless tobacco: None  . Alcohol Use: Yes     Comment: rare   OB History    No data available     Review of Systems  All  other systems reviewed and are negative.     Allergies  Penicillins  Home Medications   Prior to Admission medications   Medication Sig Start Date End Date Taking? Authorizing Provider  ARIPiprazole (ABILIFY) 2 MG tablet Take 2 mg by mouth daily.    Historical Provider, MD  Cetirizine HCl (ZYRTEC ALLERGY PO) Take 1 tablet by mouth daily as needed. For allergy.    Historical Provider, MD  desvenlafaxine (PRISTIQ) 50 MG 24 hr tablet Take 50 mg by mouth daily.      Historical Provider, MD   BP 112/75 mmHg  Pulse 78  Temp(Src) 98.3 F (36.8 C) (Oral)  Resp 16  Ht 5\' 2"  (1.575 m)  Wt 71.668 kg  BMI 28.89 kg/m2  SpO2 98% Physical Exam  Constitutional: She appears well-developed and well-nourished.  HENT:  Head: Normocephalic and atraumatic.  Neck: Normal range of motion. Neck supple.  Cardiovascular: Normal rate, regular rhythm and normal heart sounds.   Pulses:      Dorsalis pedis pulses are 2+ on the right side.  Pulmonary/Chest: Effort normal and breath sounds normal.  Musculoskeletal: Normal range of motion.       Right hip: She exhibits normal range of motion and no tenderness.  Right knee: She exhibits normal range of motion. No tenderness found.       Right ankle: She exhibits swelling. She exhibits normal range of motion, no ecchymosis and no deformity. Tenderness. Lateral malleolus tenderness found.       Cervical back: She exhibits normal range of motion, no tenderness, no bony tenderness, no swelling, no edema and no deformity.       Thoracic back: She exhibits normal range of motion, no tenderness, no bony tenderness, no swelling, no edema and no deformity.       Lumbar back: She exhibits normal range of motion, no tenderness, no bony tenderness, no swelling, no edema and no deformity.  Tenderness and swelling of the right lateral malleolus  Neurological: She is alert.  Distal sensation of right foot intact  Skin: Skin is warm and dry.  Psychiatric: She has a  normal mood and affect.  Nursing note and vitals reviewed.   ED Course  Procedures (including critical care time) Labs Review Labs Reviewed - No data to display  Imaging Review Dg Ankle Complete Right  12/04/2015  CLINICAL DATA:  Pain following fall down stairs EXAM: RIGHT ANKLE - COMPLETE 3+ VIEW COMPARISON:  None. FINDINGS: Frontal, oblique, and lateral views obtained. There is soft tissue swelling. There is evidence of old trauma in the lateral malleolar region with well corticated ossified area in the lateral malleolar region inferiorly. No acute fracture is evident. There is no appreciable joint effusion. The ankle mortise appears intact. There is a minimal posterior calcaneal spur. IMPRESSION: Evidence of old trauma lateral malleolus. No acute fracture. Ankle mortise appears intact. Minimal posterior calcaneal spur. There is soft tissue swelling, primarily laterally. Electronically Signed   By: Lowella Grip III M.D.   On: 12/04/2015 22:26   Dg Foot Complete Right  12/04/2015  CLINICAL DATA:  Pain following fall down stairs EXAM: RIGHT FOOT COMPLETE - 3+ VIEW COMPARISON:  None. FINDINGS: Frontal, oblique, and lateral views were obtained. There is no fracture or dislocation. The joint spaces appear normal. There is mild bunion formation medial to the first distal metatarsal. Calcification inferior to the lateral malleolus most likely represents residua of prior trauma. IMPRESSION: There is a degree of bunion formation medial to the first metatarsal distally. No fracture or dislocation. No appreciable arthropathy. Electronically Signed   By: Lowella Grip III M.D.   On: 12/04/2015 22:27   I have personally reviewed and evaluated these images and lab results as part of my medical decision-making.   EKG Interpretation None      MDM   Final diagnoses:  None   Patient presents today with right ankle pain that has been present since twisting her ankle and falling earlier this  evening.  Xray of right foot and ankle negative for acute findings.  Neurovascularly intact.  Patient given Ankle ASO and crutches.  RICE instructions given.  Stable for discharge.  Return precautions given.    Hyman Bible, PA-C 12/05/15 [place], DO 12/08/15 [phone]

## 2017-04-11 ENCOUNTER — Ambulatory Visit
Admission: RE | Admit: 2017-04-11 | Discharge: 2017-04-11 | Disposition: A | Discharge status: Home / Self Care | Payer: 59 | Source: Ambulatory Visit | Attending: Family Medicine | Admitting: Family Medicine

## 2017-04-11 ENCOUNTER — Other Ambulatory Visit: Payer: Self-pay | Admitting: Family Medicine

## 2017-04-11 DIAGNOSIS — R0609 Other forms of dyspnea: Principal | ICD-10-CM

## 2017-04-18 ENCOUNTER — Telehealth: Payer: Self-pay

## 2017-04-18 NOTE — Telephone Encounter (Signed)
SENT NOTES TO SCHEDULING 

## 2017-05-09 ENCOUNTER — Ambulatory Visit: Payer: 59 | Admitting: Cardiology

## 2017-05-16 NOTE — Progress Notes (Signed)
Referring-Dana [name] M.D. Reason for referral-dyspnea  HPI: 46 year old female for evaluation of dyspnea at request of Harlan Stains M.D. Chest x-ray August 2018 showed no active cardiopulmonary disease. Laboratories August 2018 showed total cholesterol 218 with LDL 115, TSH 1.57, BUN 18 hemoglobin 13.8. Patient states that she has put on approximately 40 pounds over the past several years. She has noticed gradual increase dyspnea on exertion. There is no orthopnea, PND, pedal edema, palpitations or syncope. [place] and as she was walking up a steep pill she became extremely short of breath and noticed some chest tightness. She otherwise has not had exertional chest pain. Because of the above cardiology was asked to evaluate. Note she has had occasional chest pain at rest in the past that she attributes to reflux. It does not radiate and there are no associated symptoms. Also of note she does have some improvement in her dyspnea on exertion if she uses Proventil inhaler prior to exercise.   Current Outpatient Prescriptions  Medication Sig Dispense Refill  . Cariprazine HCl (VRAYLAR) 1.5 MG CAPS Take 1.5 mg by mouth daily.    . Cetirizine HCl (ZYRTEC ALLERGY PO) Take 1 tablet by mouth daily as needed. For allergy.    Marland Kitchen desvenlafaxine (PRISTIQ) 50 MG 24 hr tablet Take 50 mg by mouth daily.      Marland Kitchen lamoTRIgine (LAMICTAL) 100 MG tablet Take 100 mg by mouth daily.    . naproxen (NAPROSYN) 500 MG tablet Take 1 tablet (500 mg total) by mouth 2 (two) times daily. 30 tablet 0   No current facility-administered medications for this visit.     Allergies  Allergen Reactions  . Penicillins Rash     Past Medical History:  Diagnosis Date  . Anxiety   . Asthma   . Complication of anesthesia 2010   hard to wake up after colonoscopy, and increased HR  . Depression   . GERD (gastroesophageal reflux disease)    no meds  . H/O hiatal hernia   . Melanoma ([place]) 2010   left  hip    Past Surgical History:  Procedure Laterality Date  . CARPAL TUNNEL RELEASE    . CYSTOSCOPY  02/25/2012   Procedure: CYSTOSCOPY;  Surgeon: Margarette Asal, MD;  Location: [facility];  Service: Gynecology;  Laterality: N/A;  . PARTIAL HYSTERECTOMY    . PUBOVAGINAL SLING  02/25/2012   Procedure: Gaynelle Arabian;  Surgeon: Margarette Asal, MD;  Location: [facility];  Service: Gynecology;  Laterality: N/A;  . TONSILLECTOMY      Social History   Social History  . Marital status: Married    Spouse name: N/A  . Number of children: 2  . Years of education: N/A   Occupational History  .      Human relations   Social History Main Topics  . Smoking status: Never Smoker  . Smokeless tobacco: Never Used  . Alcohol use Yes     Comment: rare  . Drug use: No  . Sexual activity: Not on file   Other Topics Concern  . Not on file   Social History Narrative  . No narrative on file    Family History  Problem Relation Age of Onset  . Pancreatic cancer Mother   . Hyperlipidemia Mother   . Dementia Father     ROS: no fevers or chills, productive cough, hemoptysis, dysphasia, odynophagia, melena, hematochezia, dysuria, hematuria, rash, seizure activity, orthopnea, PND, pedal edema, claudication. Remaining systems  are negative.  Physical Exam:   Blood pressure 110/72, pulse 83, height 5\' 2"  (1.575 m), weight 75.8 kg (167 lb).  General:  Well developed/well nourished in NAD Skin warm/dry Patient not depressed No peripheral clubbing Back-normal HEENT-normal/normal eyelids Neck supple/normal carotid upstroke bilaterally; no bruits; no JVD; no thyromegaly chest - CTA/ normal expansion CV - RRR/normal S1 and S2; no murmurs, rubs or gallops;  PMI nondisplaced Abdomen -NT/ND, no HSM, no mass, + bowel sounds, no bruit 2+ femoral pulses, no bruits Ext-no edema, chords, 2+ DP Neuro-grossly nonfocal  ECG - normal sinus rhythm, cannot rule out prior septal infarct. personally  reviewed  A/P  1 dyspnea- patient presents with complaints of increasing dyspnea on exertion. Etiology unclear. Possible contribution from asthma as her Proventil inhaler improves her symptoms if she uses prior to exercise. She also states she has put on 40 pounds and there may be a component of deconditioning. I will arrange an exercise treadmill to screen for coronary disease and risk stratify. Schedule echocardiogram to assess LV systolic and diastolic function. If normal I have asked her to increase her activities/exercise and we discussed weight loss.  2 Chest tightness-she had vague chest tightness with her [place]. As outlined we will arrange an exercise treadmill for risk stratification.  3 Possible asthma-management per primary care.   Kirk Ruths, MD

## 2017-05-23 ENCOUNTER — Telehealth: Payer: Self-pay | Admitting: Cardiology

## 2017-05-23 NOTE — Telephone Encounter (Signed)
Received records from [facility] for appointment on 05/25/17 with Dr Stanford Breed.  Records put with Dr Jacalyn Lefevre schedule for 05/25/17. lp

## 2017-05-25 ENCOUNTER — Encounter: Payer: Self-pay | Admitting: Cardiology

## 2017-05-25 ENCOUNTER — Ambulatory Visit (INDEPENDENT_AMBULATORY_CARE_PROVIDER_SITE_OTHER): Payer: 59 | Admitting: Cardiology

## 2017-05-25 ENCOUNTER — Ambulatory Visit: Payer: 59 | Admitting: Cardiology

## 2017-05-25 VITALS — BP 110/72 | HR 83 | Ht 62.0 in | Wt 167.0 lb

## 2017-05-25 DIAGNOSIS — R0609 Other forms of dyspnea: Secondary | ICD-10-CM | POA: Diagnosis not present

## 2017-05-25 DIAGNOSIS — R072 Precordial pain: Secondary | ICD-10-CM | POA: Diagnosis not present

## 2017-05-25 NOTE — Patient Instructions (Addendum)
Medication Instructions:   NO CHANGE  Testing/Procedures:  Your physician has requested that you have an exercise tolerance test. For further information please visit [email or web address]. Please also follow instruction sheet, as given.   Your physician has requested that you have an echocardiogram. Echocardiography is a painless test that uses sound waves to create images of your heart. It provides your doctor with information about the size and shape of your heart and how well your heart's chambers and valves are working. This procedure takes approximately one hour. There are no restrictions for this procedure.    Follow-Up:  Your physician recommends that you schedule a follow-up appointment in: AS NEEDED PENDING TEST RESULTS     Exercise Stress Electrocardiogram An exercise stress electrocardiogram is a test to check how blood flows to your heart. It is done to find areas of poor blood flow. You will need to walk on a treadmill for this test. The electrocardiogram will record your heartbeat when you are at rest and when you are exercising. What happens before the procedure?  Do not have drinks with caffeine or foods with caffeine for 24 hours before the test, or as told by your doctor. This includes coffee, tea (even decaf tea), sodas, chocolate, and cocoa.  Follow your doctor's instructions about eating and drinking before the test.  Ask your doctor what medicines you should or should not take before the test. Take your medicines with water unless told by your doctor not to.  If you use an inhaler, bring it with you to the test.  Bring a snack to eat after the test.  Do not  smoke for 4 hours before the test.  Do not put lotions, powders, creams, or oils on your chest before the test.  Wear comfortable shoes and clothing. What happens during the procedure?  You will have patches put on your chest. Small areas of your chest may need to be shaved. Wires will be connected to the  patches.  Your heart rate will be watched while you are resting and while you are exercising.  You will walk on the treadmill. The treadmill will slowly get faster to raise your heart rate.  The test will take about 1-2 hours. What happens after the procedure?  Your heart rate and blood pressure will be watched after the test.  You may return to your normal diet, activities, and medicines or as told by your doctor. This information is not intended to replace advice given to you by your health care provider. Make sure you discuss any questions you have with your health care provider. Document Released: 01/26/2008 Document Revised: 04/07/2016 Document Reviewed: 04/16/2013 Elsevier Interactive Patient Education  [employer].

## 2017-06-07 ENCOUNTER — Other Ambulatory Visit: Payer: Self-pay

## 2017-06-07 ENCOUNTER — Ambulatory Visit (INDEPENDENT_AMBULATORY_CARE_PROVIDER_SITE_OTHER): Payer: 59

## 2017-06-07 ENCOUNTER — Ambulatory Visit (HOSPITAL_COMMUNITY): Payer: 59 | Attending: Cardiovascular Disease

## 2017-06-07 DIAGNOSIS — F419 Anxiety disorder, unspecified: Secondary | ICD-10-CM | POA: Insufficient documentation

## 2017-06-07 DIAGNOSIS — J45909 Unspecified asthma, uncomplicated: Secondary | ICD-10-CM | POA: Diagnosis not present

## 2017-06-07 DIAGNOSIS — R0609 Other forms of dyspnea: Secondary | ICD-10-CM

## 2017-06-07 LAB — EXERCISE TOLERANCE TEST
CHL CUP RESTING HR STRESS: 86 {beats}/min
CHL RATE OF PERCEIVED EXERTION: 15
CSEPEDS: 32 s
CSEPHR: 96 %
Estimated workload: 9.3 METS
Exercise duration (min): 7 min
MPHR: 172 {beats}/min
Peak HR: 166 {beats}/min

## 2017-10-21 IMAGING — CR DG ANKLE COMPLETE 3+V*R*
3 series · 3 of 3 positions shown · non-contrast
Comparison: None.

CLINICAL DATA: Pain following fall down stairs

EXAM:
RIGHT ANKLE - COMPLETE 3+ VIEW

[t ankle joint ap right]
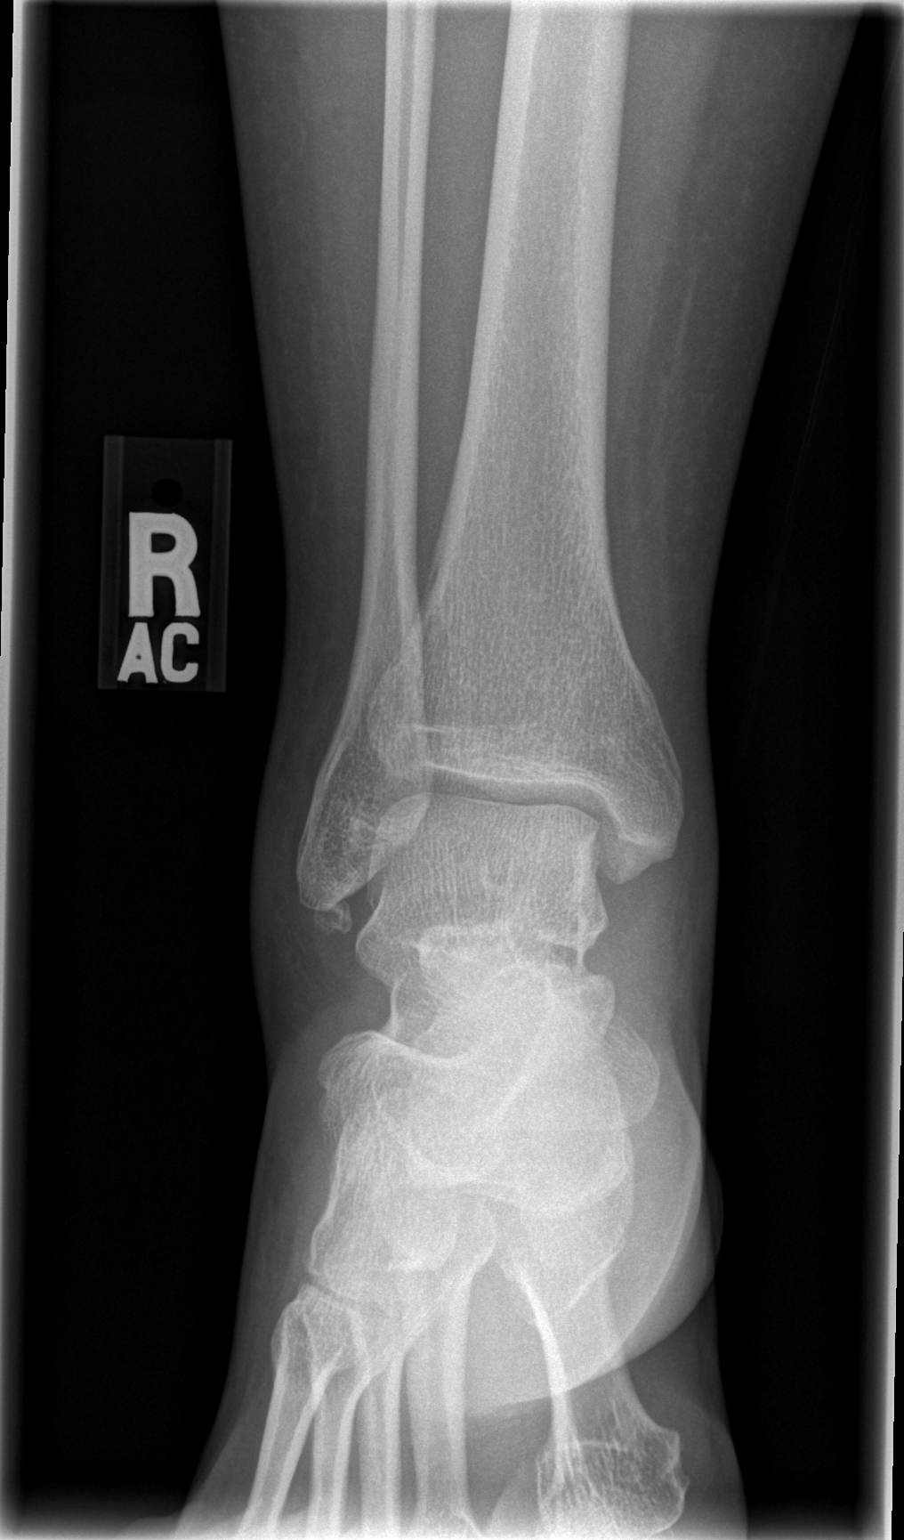

[t ankle joint oblique right]
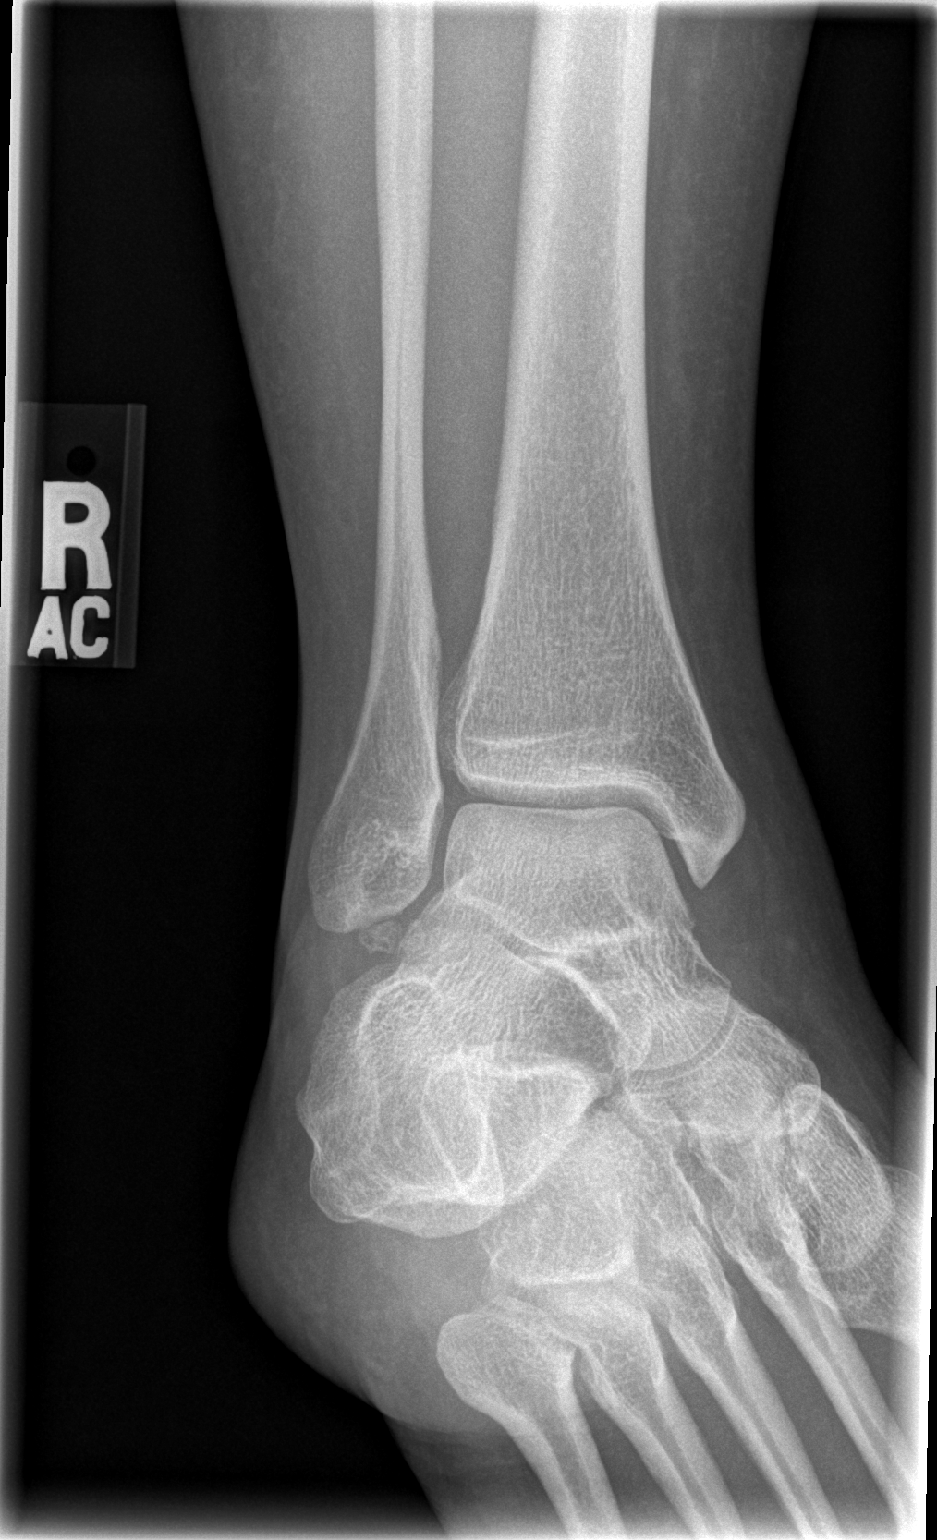

[t ankle joint lat right]
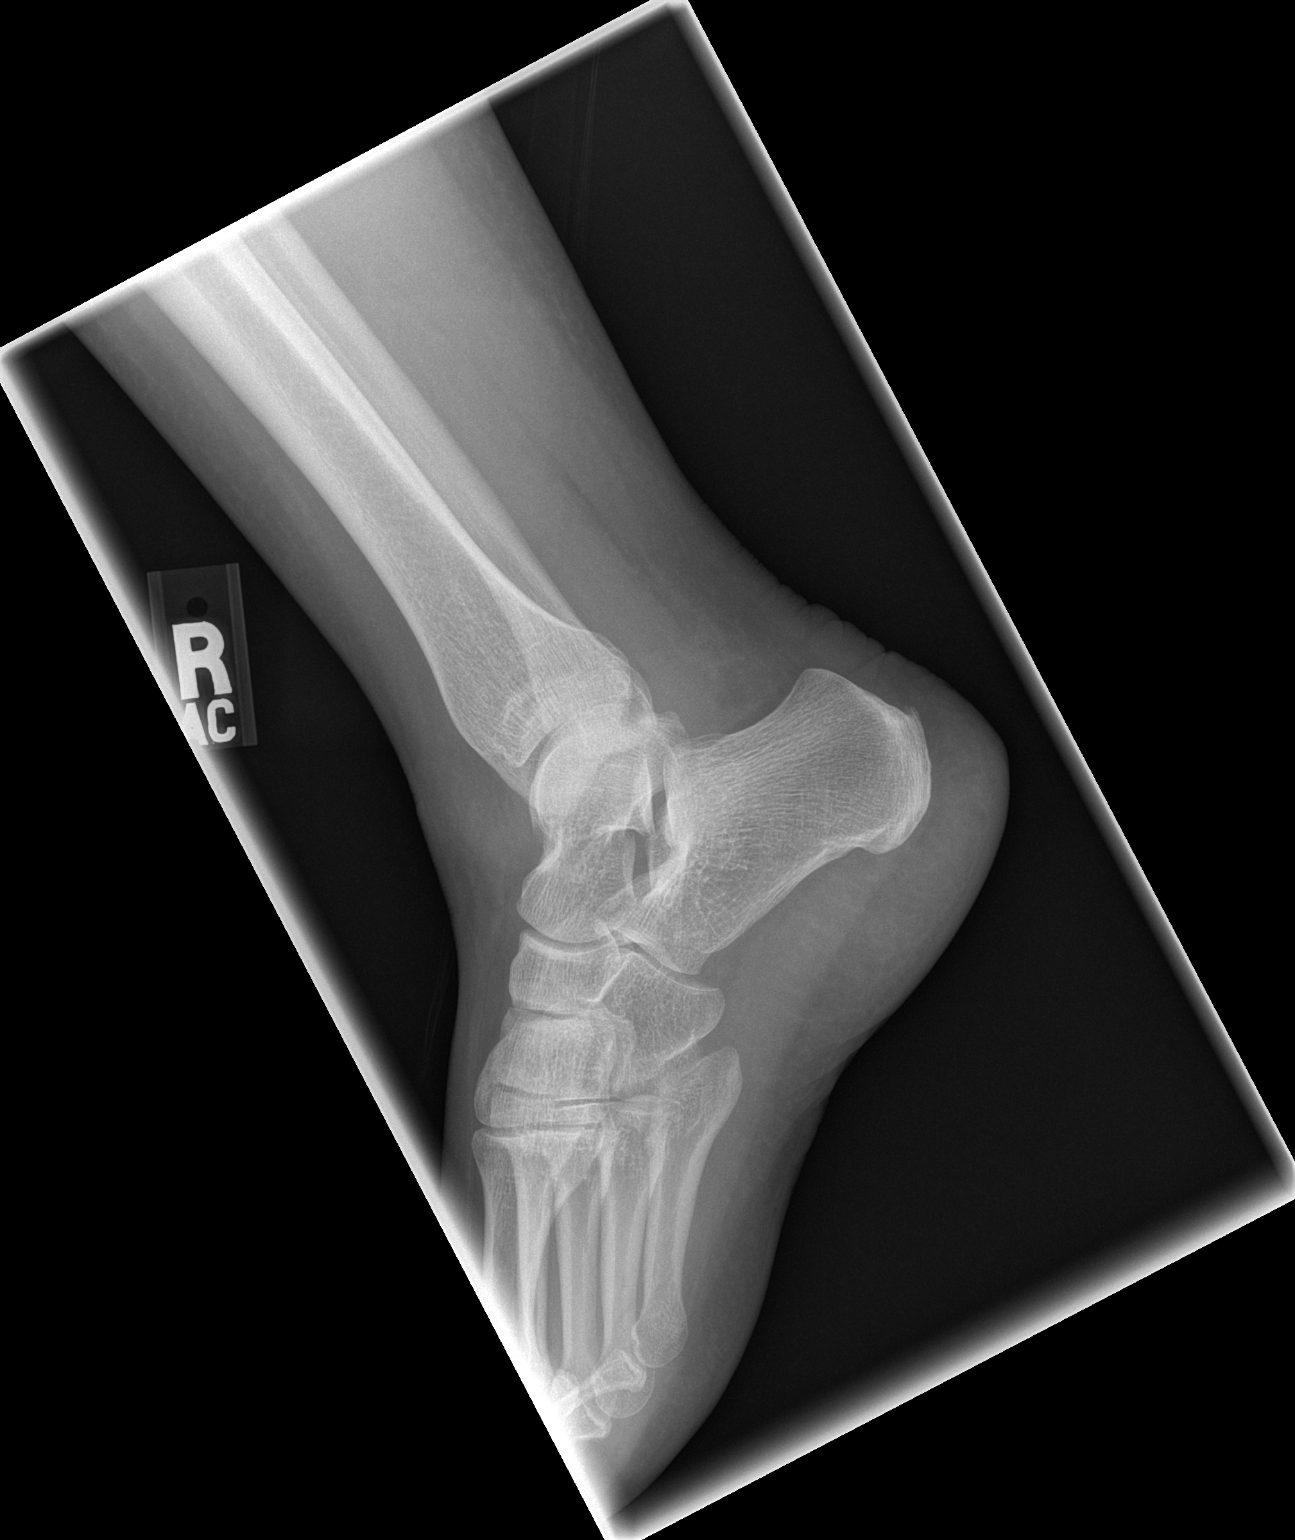

[3 of 3 positions shown; findings below may reference images not displayed]

FINDINGS: Frontal, oblique, and lateral views obtained. There is soft tissue
swelling. There is evidence of old trauma in the lateral malleolar
region with well corticated ossified area in the lateral malleolar
region inferiorly. No acute fracture is evident. There is no
appreciable joint effusion. The ankle mortise appears intact. There
is a minimal posterior calcaneal spur.
IMPRESSION: Evidence of old trauma lateral malleolus. No acute fracture. Ankle
mortise appears intact. Minimal posterior calcaneal spur. There is
soft tissue swelling, primarily laterally.

## 2018-04-17 ENCOUNTER — Other Ambulatory Visit: Payer: Self-pay | Admitting: Family Medicine

## 2018-04-17 DIAGNOSIS — N183 Chronic kidney disease, stage 3 unspecified: Secondary | ICD-10-CM

## 2018-04-26 ENCOUNTER — Other Ambulatory Visit: Payer: 59

## 2018-04-27 ENCOUNTER — Ambulatory Visit
Admission: RE | Admit: 2018-04-27 | Discharge: 2018-04-27 | Disposition: A | Discharge status: Home / Self Care | Payer: 59 | Source: Ambulatory Visit | Attending: Family Medicine | Admitting: Family Medicine

## 2018-04-27 DIAGNOSIS — N183 Chronic kidney disease, stage 3 unspecified: Secondary | ICD-10-CM

## 2018-06-07 ENCOUNTER — Ambulatory Visit: Payer: 59 | Admitting: Psychiatry

## 2018-06-07 ENCOUNTER — Other Ambulatory Visit: Payer: Self-pay | Admitting: Psychiatry

## 2018-06-07 DIAGNOSIS — F3181 Bipolar II disorder: Secondary | ICD-10-CM | POA: Diagnosis not present

## 2018-06-07 DIAGNOSIS — R42 Dizziness and giddiness: Secondary | ICD-10-CM

## 2018-06-07 NOTE — Progress Notes (Signed)
[facility]  Patient ID: Dana [name],  MRN: [id number]  PCP: Harlan Stains, MD  Date of Evaluation: 06/07/2018 Time spent:15 minutes   HISTORY/CURRENT STATUS: HPI OK overall with mood and anxiety.  More tired lately.  Enough sleep.  Changed meds to HS. Falling a lot lately for awhile.  Not sure how long. Balance not as good.  Seems to be related to some dizziness but it is not consistently orthostatic.  She has about one fall per month.  Wonders if it could be related to the psychiatric medicines.  Individual Medical History/ Review of Systems: Changes? :Yes Kidney consult ok.  Needs to deal with weight.  Seeing ortho re: ankle.  Allergies: Penicillins  Current Medications:  Current Outpatient Medications:  .  Cariprazine HCl (VRAYLAR) 1.5 MG CAPS, Take 1.5 mg by mouth daily., Disp: , Rfl:  .  desvenlafaxine (PRISTIQ) 50 MG 24 hr tablet, Take 50 mg by mouth daily.  , Disp: , Rfl:  .  lamoTRIgine (LAMICTAL) 200 MG tablet, Take 100 mg by mouth daily. , Disp: , Rfl:  .  Cetirizine HCl (ZYRTEC ALLERGY PO), Take 1 tablet by mouth daily as needed. For allergy., Disp: , Rfl:  .  naproxen (NAPROSYN) 500 MG tablet, Take 1 tablet (500 mg total) by mouth 2 (two) times daily. (Patient not taking: Reported on 06/07/2018), Disp: 30 tablet, Rfl: 0  Rarely misses meds. Same doses for 3 years except Vraylar. Medication Side Effects: Other: ? re balance.  Family Medical/ Social History: Changes? No  MENTAL HEALTH EXAM:  There were no vitals taken for this visit.There is no height or weight on file to calculate BMI.  General Appearance: Casual  Eye Contact:  Good  Speech:  Clear and Coherent and Normal Rate  Volume:  Normal  Mood:  Euthymic  Affect:  Full Range  Thought Process:  Goal Directed  Orientation:  Full (Time, Place, and Person)  Thought Content: WDL   Suicidal Thoughts:  No  Homicidal Thoughts:  No  Memory:  Recent  Judgement:  Good  Insight:  Good   Psychomotor Activity:  Normal  Concentration:  Concentration: Good  Recall:  Good  Fund of Knowledge: Good  Language: Good  Akathisia:  No  AIMS (if indicated): not done  Assets:  Communication Skills Desire for Improvement Financial Resources/Insurance Housing Leisure Time Social Support Vocational/Educational  ADL's:  Intact  Cognition: WNL  Prognosis:  Good    DIAGNOSES:    ICD-10-CM   1. Bipolar II disorder (HCC) F31.81 Lamotrigine level  2. Dizziness and giddiness R42 Lamotrigine level    RECOMMENDATIONS:  Greater than 50% of face to face time with patient was spent on counseling and coordination of care. We discussed falls and poss relationship to meds.  ? Dt lamottrigine?  ? Check level? Disc SSRI withdrawal if miss pristiq will cause dizziness. Be consistent.  It is unlikely that Arman Filter is causing any balance issues.  Sometimes rapid metabolizers will get dizzy on Pristiq.  It is also possible that she could be a slow metabolizer of lamotrigine and the level could be higher than expected. Check lamotrigine level.  If this is normal and all orthopedic workup is normal I would suggest she see a neurologist for evaluation.  She agrees to the plan. FU 9 months if no changes need to be made.  Purnell Shoemaker, MD

## 2018-06-12 LAB — LAMOTRIGINE LEVEL: LAMOTRIGINE LVL: 3.9 ug/mL — AB (ref 4.0–18.0)

## 2018-07-11 ENCOUNTER — Other Ambulatory Visit: Payer: Self-pay

## 2018-07-11 MED ORDER — LAMOTRIGINE 200 MG PO TABS: 200.0000 mg | ORAL_TABLET | Freq: Every day | ORAL | 2 refills | Status: DC

## 2018-08-14 ENCOUNTER — Other Ambulatory Visit: Payer: Self-pay

## 2018-08-14 MED ORDER — CARIPRAZINE HCL 1.5 MG PO CAPS: 1.5000 mg | ORAL_CAPSULE | Freq: Every day | ORAL | 6 refills | Status: DC

## 2018-08-14 MED ORDER — DESVENLAFAXINE SUCCINATE ER 50 MG PO TB24: 50.0000 mg | ORAL_TABLET | Freq: Every day | ORAL | 6 refills | Status: DC

## 2018-08-14 NOTE — Telephone Encounter (Signed)
Error opened wrong chart

## 2019-02-27 IMAGING — CR DG CHEST 2V
2 series · 2 of 2 positions shown · non-contrast
Comparison: None.

CLINICAL DATA: Dyspnea on exertion

EXAM:
CHEST  2 VIEW

[w chest pa]
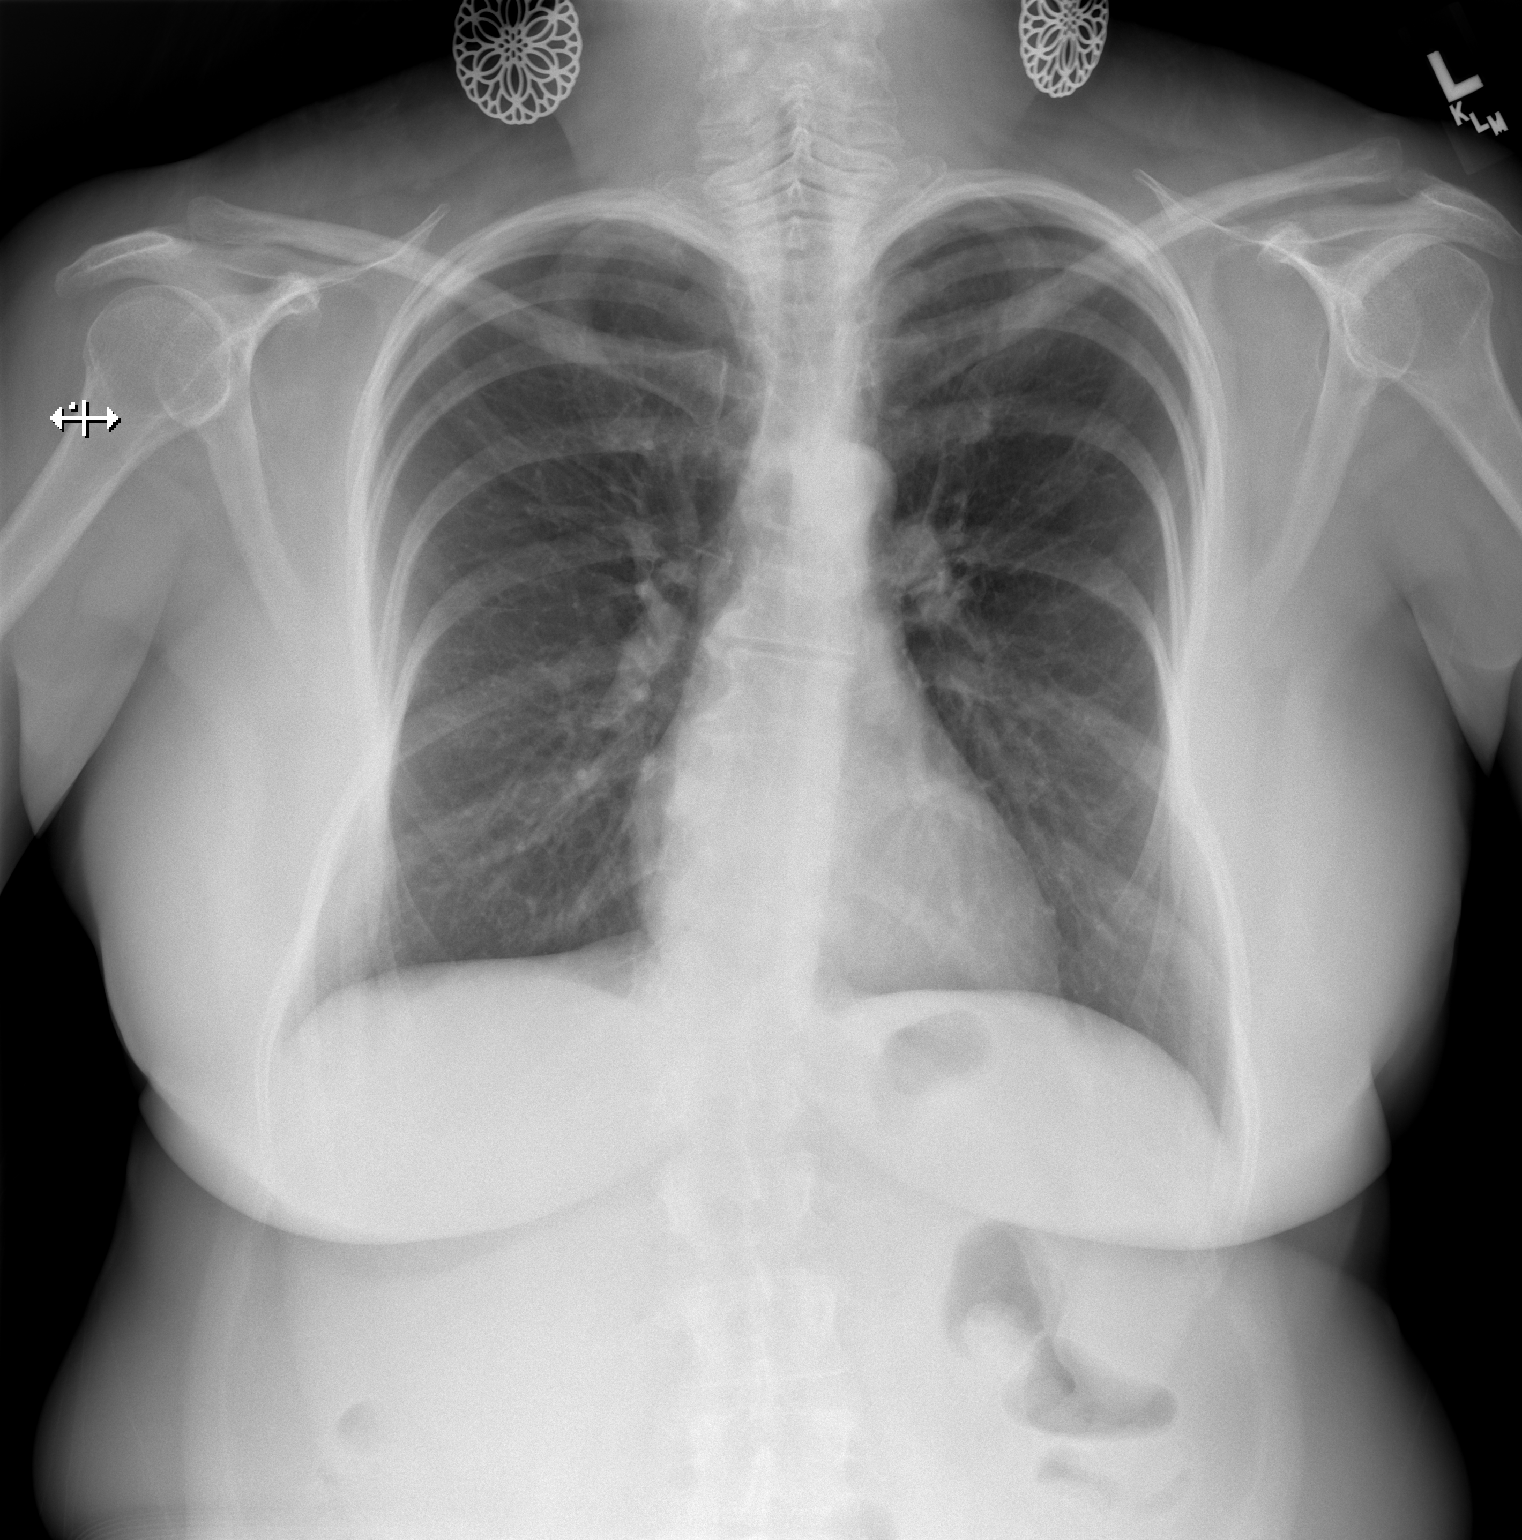

[w chest lat]
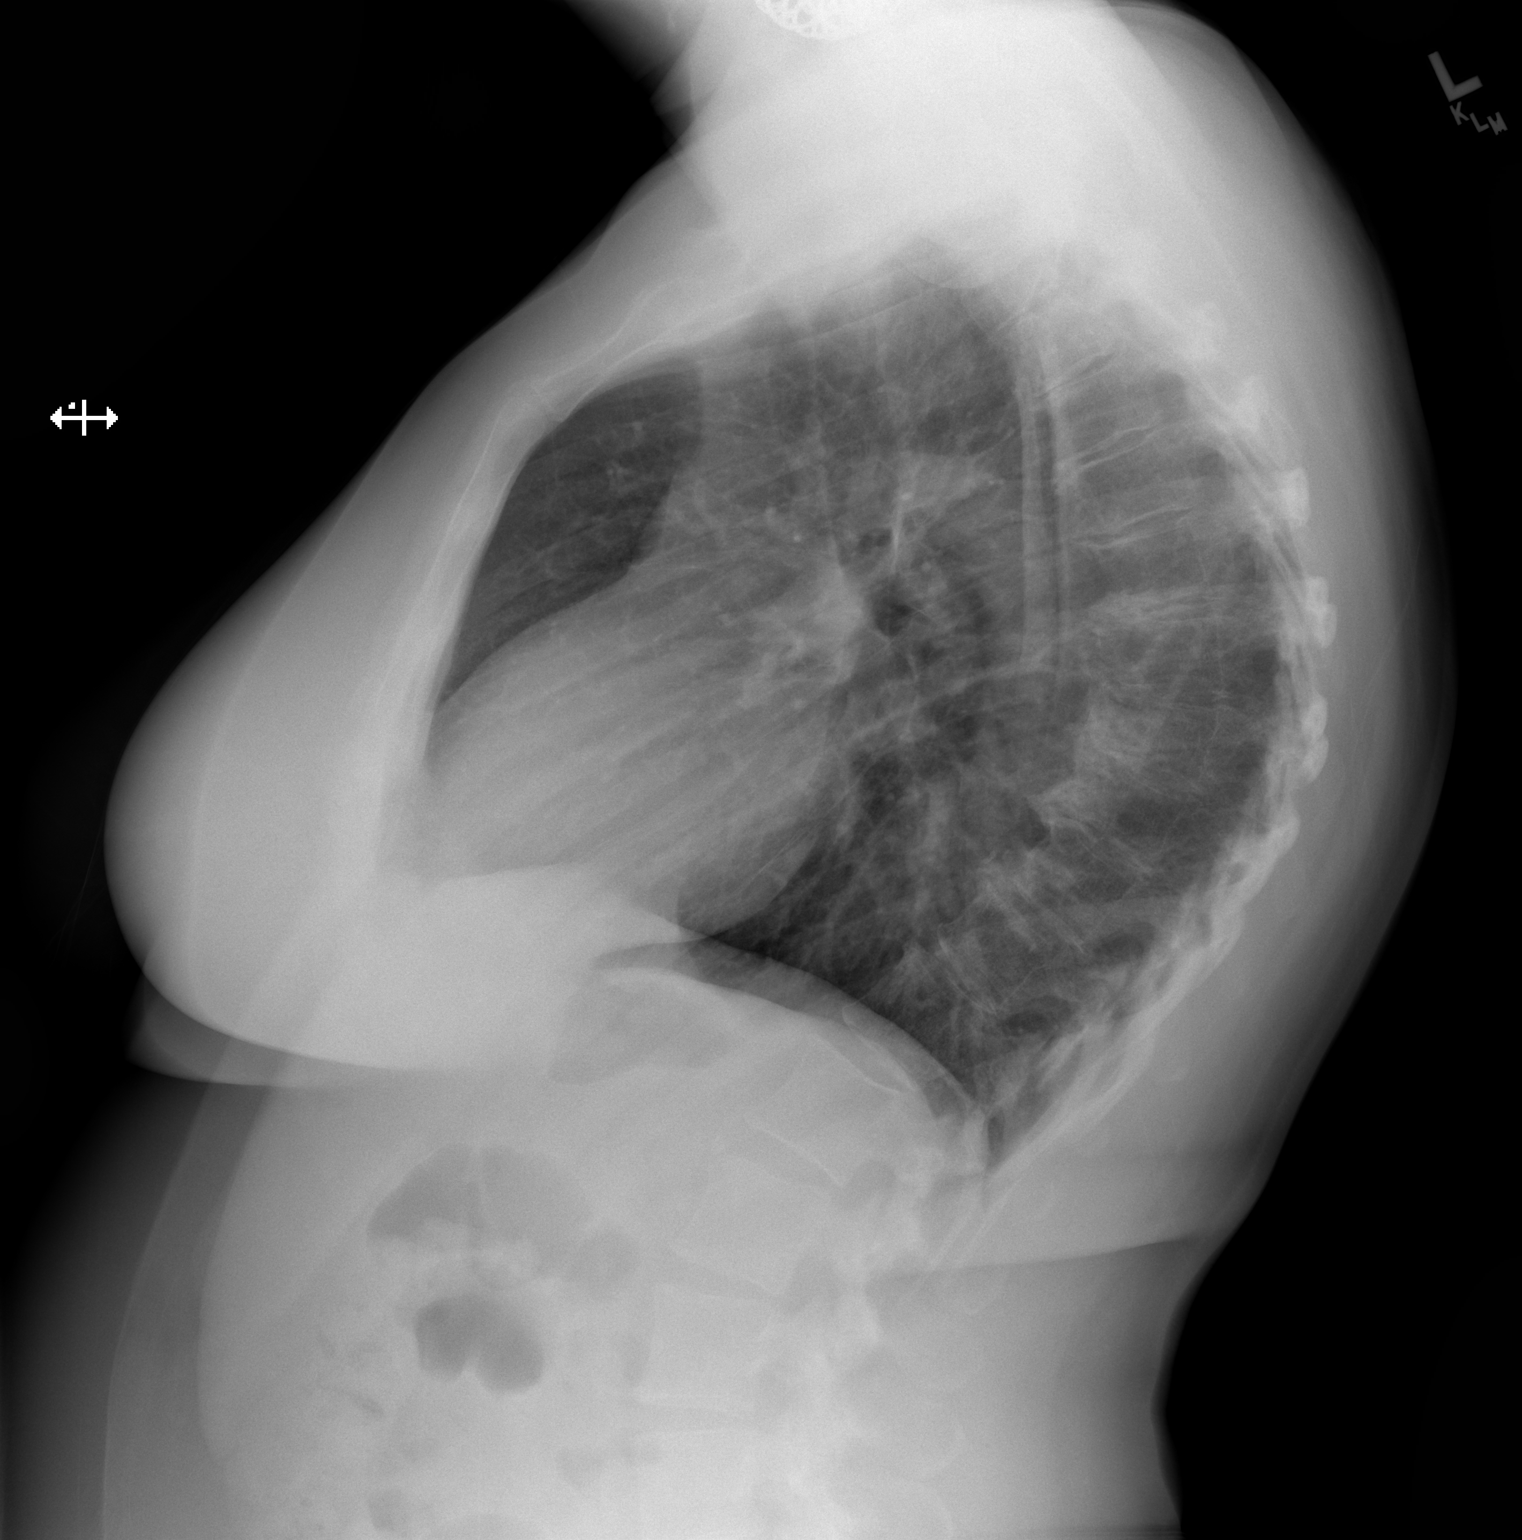

[2 of 2 positions shown; findings below may reference images not displayed]

FINDINGS: Normal heart size. Normal mediastinal contour. No pneumothorax. No
pleural effusion. Lungs appear clear, with no acute consolidative
airspace disease and no pulmonary edema.
IMPRESSION: No active cardiopulmonary disease.

## 2019-03-08 ENCOUNTER — Ambulatory Visit: Payer: 59 | Admitting: Psychiatry

## 2019-03-19 ENCOUNTER — Other Ambulatory Visit: Payer: Self-pay | Admitting: Psychiatry

## 2019-04-05 ENCOUNTER — Ambulatory Visit (INDEPENDENT_AMBULATORY_CARE_PROVIDER_SITE_OTHER): Payer: 59 | Admitting: Psychiatry

## 2019-04-05 ENCOUNTER — Other Ambulatory Visit: Payer: Self-pay

## 2019-04-05 ENCOUNTER — Encounter: Payer: Self-pay | Admitting: Psychiatry

## 2019-04-05 DIAGNOSIS — F3181 Bipolar II disorder: Secondary | ICD-10-CM | POA: Diagnosis not present

## 2019-04-05 DIAGNOSIS — F411 Generalized anxiety disorder: Secondary | ICD-10-CM | POA: Diagnosis not present

## 2019-04-05 NOTE — Progress Notes (Signed)
Dana [name] [id number] [birth year] 48 y.o.  Subjective:   Patient ID:  Dana [name] is a 48 y.o. (DOB 1970/10/07) female.  Chief Complaint:  Chief Complaint  Patient presents with  . Follow-up    mood and anxiety  . Medication Problem    wants to try stopping meds    HPI Clifton Kovacic presents to the office today for follow-up of bipolar disorder type II and generalized anxiety disorder and history of panic attacks.  Last seen October 2019.   Fine but wonders if she can ever get off any of the meds.  Current meds since 2016 when Vraylar added and Pristiq reduced and lamotrigine increased.  Quite a few depressive episodes.   Patient reports stable mood and denies depressed or irritable moods.  Patient denies any recent difficulty with anxiety.  Patient denies difficulty with sleep initiation or maintenance. Denies appetite disturbance.  Patient reports that energy and motivation have been good.  Patient denies any difficulty with concentration.  Patient denies any suicidal ideation. Last panic in a long while.  Past Psychiatric Medication Trials: Lamotrigine 200, Pristiq 100 mg, Vraylar 3 dizzy, sertraline, fluoxetine, buspirone, Wellbutrin, Abilify  Review of Systems:  Review of Systems  Constitutional: Positive for unexpected weight change.  Neurological: Positive for dizziness. Negative for tremors and weakness.    Medications: I have reviewed the patient's current medications.  Current Outpatient Medications  Medication Sig Dispense Refill  . Cetirizine HCl (ZYRTEC ALLERGY PO) Take 1 tablet by mouth daily as needed. For allergy.    Marland Kitchen desvenlafaxine (PRISTIQ) 50 MG 24 hr tablet Take 1 tablet (50 mg total) by mouth daily. 30 tablet 6  . lamoTRIgine (LAMICTAL) 200 MG tablet Take 1 tablet (200 mg total) by mouth daily. 90 tablet 2  . VRAYLAR capsule Take 1 capsule by mouth once daily 30 capsule 0  . naproxen (NAPROSYN) 500 MG tablet Take 1 tablet (500 mg total)  by mouth 2 (two) times daily. (Patient not taking: Reported on 06/07/2018) 30 tablet 0   No current facility-administered medications for this visit.     Medication Side Effects: None  Allergies:  Allergies  Allergen Reactions  . Penicillins Rash    Past Medical History:  Diagnosis Date  . Anxiety   . Asthma   . Complication of anesthesia 2010   hard to wake up after colonoscopy, and increased HR  . Depression   . GERD (gastroesophageal reflux disease)    no meds  . H/O hiatal hernia   . Melanoma ([place]) 2010   left hip    Family History  Problem Relation Age of Onset  . Pancreatic cancer Mother   . Hyperlipidemia Mother   . Dementia Father     Social History   Socioeconomic History  . Marital status: Married    Spouse name: Not on file  . Number of children: 2  . Years of education: Not on file  . Highest education level: Not on file  Occupational History    Comment: Human relations  Social Needs  . Financial resource strain: Not on file  . Food insecurity    Worry: Not on file    Inability: Not on file  . Transportation needs    Medical: Not on file    Non-medical: Not on file  Tobacco Use  . Smoking status: Never Smoker  . Smokeless tobacco: Never Used  Substance and Sexual Activity  . Alcohol use: Yes    Comment: rare  .  Drug use: No  . Sexual activity: Not on file  Lifestyle  . Physical activity    Days per week: Not on file    Minutes per session: Not on file  . Stress: Not on file  Relationships  . Social Herbalist on phone: Not on file    Gets together: Not on file    Attends religious service: Not on file    Active member of club or organization: Not on file    Attends meetings of clubs or organizations: Not on file    Relationship status: Not on file  . Intimate partner violence    Fear of current or ex partner: Not on file    Emotionally abused: Not on file    Physically abused: Not on file    Forced sexual activity: Not  on file  Other Topics Concern  . Not on file  Social History Narrative  . Not on file    Past Medical History, Surgical history, Social history, and Family history were reviewed and updated as appropriate.   Please see review of systems for further details on the patient's review from today.   Objective:   Physical Exam:  There were no vitals taken for this visit.  Physical Exam Constitutional:      General: She is not in acute distress.    Appearance: She is well-developed. She is obese.  Musculoskeletal:        General: No deformity.  Neurological:     Mental Status: She is alert and oriented to person, place, and time.     Coordination: Coordination normal.  Psychiatric:        Attention and Perception: Attention and perception normal. She does not perceive auditory or visual hallucinations.        Mood and Affect: Mood normal. Mood is not anxious or depressed. Affect is not labile, blunt, angry or inappropriate.        Speech: Speech normal.        Behavior: Behavior normal.        Thought Content: Thought content normal. Thought content is not paranoid or delusional. Thought content does not include homicidal or suicidal ideation. Thought content does not include homicidal or suicidal plan.        Cognition and Memory: Cognition and memory normal.        Judgment: Judgment normal.     Comments: Insight intact     Lab Review:  No results found for: NA, K, CL, CO2, GLUCOSE, BUN, CREATININE, CALCIUM, PROT, ALBUMIN, AST, ALT, ALKPHOS, BILITOT, GFRNONAA, GFRAA     Component Value Date/Time   WBC 6.7 02/18/2012 1130   RBC 4.31 02/18/2012 1130   HGB 13.0 02/18/2012 1130   HCT 37.5 02/18/2012 1130   PLT 199 02/18/2012 1130   MCV 87.0 02/18/2012 1130   MCH 30.2 02/18/2012 1130   MCHC 34.7 02/18/2012 1130   RDW 13.1 02/18/2012 1130    No results found for: POCLITH, LITHIUM   No results found for: PHENYTOIN, PHENOBARB, VALPROATE, CBMZ   .res Assessment: Plan:     There are no diagnoses linked to this encounter.    Greater than 50% of face to face time with patient was spent on counseling and coordination of care. We discussed She admits a history of multiple depressive episodes.  She has been seen in our office since 2007.  There has been debate about whether she has an irritable depression or bipolar type II.  The  decision was made to change her diagnosis to bipolar type II in part based on reactions to medication.  No change in diagnosis was made in 2006.  She had problems with irritability on Pristiq 100 mg daily.  Symptoms went into remission with the addition of Vraylar 1.5 mg at that time and the reduction in Pristiq.  Her symptoms have remained in good control since then.  It was discussed with her that because of the number of depressive episode she has had in her lifetime it is highly likely she will relapse without maintenance medication.  She has had good response with the Vraylar and we will expect to continue that medication.  She would like to try to reduce the medications.  Given the length of stability it is reasonable.  Lamotrigine is likely the least potent medication.  It was clearly not adequate on its own to control her mood symptoms.  It is likely that she will remain stable even if she tapers off of that medication.  Discussed the alternative of weaning Pristiq first.  That probably represents more risk including withdrawal risk.    It seems reasonable to try to taper the lamotrigine.  She will call us if she has any relapse in mood symptoms.  We also discussed the potential for some withdrawal irritability or edginess and if it is too severe to let us know. Taper lamotrigine 200 mg tablet 3/4 of a tablet for 1 month Then 1/2 tablet daily for 1 month Then 1/4 tablet daily for a month  Then stop it.  Continue Vraylar 1.5 mg and Pristiq 50 mg daily until follow-up.  If stable may try to wean the Pristiq.   Disc SE in detail and SSRI  withdrawal sx.  Discussed potential metabolic side effects associated with atypical antipsychotics, as well as potential risk for movement side effects. Advised pt to contact office if movement side effects occur.   Expect stability so follow-up in 6 months.  Call as needed.  This appt was 30 mins.  Lynder Parents MD, DFAPA  Please see After Visit Summary for patient specific instructions.  No future appointments.  No orders of the defined types were placed in this encounter.   -------------------------------

## 2019-04-05 NOTE — Patient Instructions (Addendum)
Taper lamotrigine 200 mg tablet 3/4 of a tablet for 1 month Then 1/2 tablet daily for 1 month Then 1/4 tablet daily for a month  Then stop it.

## 2019-04-19 ENCOUNTER — Other Ambulatory Visit: Payer: Self-pay | Admitting: Psychiatry

## 2019-07-23 ENCOUNTER — Telehealth: Payer: Self-pay | Admitting: Cardiology

## 2019-07-23 ENCOUNTER — Other Ambulatory Visit: Payer: Self-pay | Admitting: *Deleted

## 2019-07-23 DIAGNOSIS — R002 Palpitations: Secondary | ICD-10-CM

## 2019-07-23 NOTE — Telephone Encounter (Signed)
Referral in epic for event monitor. Please call patient to schedule.

## 2019-07-23 NOTE — Telephone Encounter (Signed)
[employer] to ship a 30 day cardiac event monitor to the patients home.

## 2019-07-25 ENCOUNTER — Ambulatory Visit (INDEPENDENT_AMBULATORY_CARE_PROVIDER_SITE_OTHER): Payer: 59 | Admitting: Family

## 2019-07-25 ENCOUNTER — Other Ambulatory Visit: Payer: Self-pay

## 2019-07-25 ENCOUNTER — Encounter: Payer: Self-pay | Admitting: Family

## 2019-07-25 ENCOUNTER — Ambulatory Visit (INDEPENDENT_AMBULATORY_CARE_PROVIDER_SITE_OTHER): Payer: 59

## 2019-07-25 VITALS — BP 124/78 | HR 93 | Ht 62.0 in | Wt 180.0 lb

## 2019-07-25 DIAGNOSIS — E663 Overweight: Secondary | ICD-10-CM

## 2019-07-25 DIAGNOSIS — F439 Reaction to severe stress, unspecified: Secondary | ICD-10-CM | POA: Diagnosis not present

## 2019-07-25 DIAGNOSIS — R002 Palpitations: Secondary | ICD-10-CM

## 2019-07-25 NOTE — Progress Notes (Signed)
Cardiology Clinic Note   Patient Name: GERIYAH [name] Date of Encounter: 07/25/2019  Primary Care Provider:  Harlan Stains, MD Primary Cardiologist:  Berniece Salines, DO Electrophysiologist:  None   Patient Profile    Dana [name] is a 48 y.o. female with a hx of palpitations, dyspnea, GERD, bipolar II disorder. Presents today for palpitations and ZIO placement.   Past Medical History    Past Medical History:  Diagnosis Date   Anxiety    Asthma    Complication of anesthesia 2010   hard to wake up after colonoscopy, and increased HR   Depression    GERD (gastroesophageal reflux disease)    no meds   H/O hiatal hernia    Melanoma ([place]) 2010   left hip   Past Surgical History:  Procedure Laterality Date   CARPAL TUNNEL RELEASE     CYSTOSCOPY  02/25/2012   Procedure: CYSTOSCOPY;  Surgeon: Margarette Asal, MD;  Location: [facility];  Service: Gynecology;  Laterality: N/A;   PARTIAL HYSTERECTOMY     PUBOVAGINAL SLING  02/25/2012   Procedure: Gaynelle Arabian;  Surgeon: Margarette Asal, MD;  Location: [facility];  Service: Gynecology;  Laterality: N/A;   TONSILLECTOMY      Allergies  Allergies  Allergen Reactions   Penicillins Rash    History of Present Illness   LAKISHIA [name] is a 48 y.o. female with a hx of palpitations, dyspnea, GERD, bipolar II disorder last seen by Dr. Stanford Breed 05/25/2017.   At that time she was evaluated for dyspnea and chest tightness. Exercise tolerance test 06/07/17 no evidence of ischemia. Echo 06/07/17 normal LVEF 123456, gr2 diastolic dysfunction, no valvular abnormalities.   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   She has had palpitations for at least one year. Reports they have been worse recently. Notices at rest and with activity. Tells me it feels like a "quick heart beat". Last a few seconds to minutes. No noted aggravating nor relieving factors.   No chest pain nor shortness of breath. No dizziness, syncope.   She  endorses she does drink significant caffeine in the form of coffee. Drinks socially, but not regularly. Takes no prescribed nor OTC proarrthymic medications. She does not smoke. Does report increased stress recently.  We spent the majority of our visit discussing common causes of palpitations.   EKGs/Labs/Other Studies Reviewed:   The following studies were reviewed today:  Echo 06/07/17 Study Conclusions   - Left ventricle: The cavity size was normal. Wall thickness was   normal. Systolic function was normal. The estimated ejection   fraction was in the range of 60% to 65%. Wall motion was normal;   there were no regional wall motion abnormalities. Features are   consistent with a pseudonormal left ventricular filling pattern,   with concomitant abnormal relaxation and increased filling   pressure (grade 2 diastolic dysfunction).   Exercise Tolerance Test 06/07/17  Blood pressure demonstrated a normal response to exercise.  There was no ST segment deviation noted during stress.   1. Average exercise tolerance.  2. No evidence for ischemia by ST segment analysis.    Recent Labs: No results found for requested labs within last 8760 hours.  Recent Lipid Panel No results found for: CHOL, TRIG, HDL, CHOLHDL, VLDL, LDLCALC, LDLDIRECT  Home Medications      Current Meds  Medication Sig   Cetirizine HCl (ZYRTEC ALLERGY PO) Take 1 tablet by mouth daily as needed. For allergy.  desvenlafaxine (PRISTIQ) 50 MG 24 hr tablet Take 1 tablet (50 mg total) by mouth daily.   VRAYLAR capsule Take 1 capsule by mouth once daily (Patient taking differently: Take 1.5 mg by mouth daily. )     Family History    Family History  Problem Relation Age of Onset   Pancreatic cancer Mother    Hyperlipidemia Mother    Dementia Father    She indicated that her mother is deceased. She indicated that her father is deceased.  Social History    Social History   Socioeconomic History    Marital status: Married    Spouse name: Not on file   Number of children: 2   Years of education: Not on file   Highest education level: Not on file  Occupational History    Comment: Human relations  Social Designer, fashion/clothing strain: Not on file   Food insecurity    Worry: Not on file    Inability: Not on file   Transportation needs    Medical: Not on file    Non-medical: Not on file  Tobacco Use   Smoking status: Never Smoker   Smokeless tobacco: Never Used  Substance and Sexual Activity   Alcohol use: Yes    Comment: rare   Drug use: No   Sexual activity: Not on file  Lifestyle   Physical activity    Days per week: Not on file    Minutes per session: Not on file   Stress: Not on file  Relationships   Social connections    Talks on phone: Not on file    Gets together: Not on file    Attends religious service: Not on file    Active member of club or organization: Not on file    Attends meetings of clubs or organizations: Not on file    Relationship status: Not on file   Intimate partner violence    Fear of current or ex partner: Not on file    Emotionally abused: Not on file    Physically abused: Not on file    Forced sexual activity: Not on file  Other Topics Concern   Not on file  Social History Narrative   Not on file     Review of Systems    Review of Systems  Constitution: Negative for chills, fever and malaise/fatigue.  Cardiovascular: Positive for palpitations. Negative for chest pain, dyspnea on exertion, leg swelling, near-syncope and orthopnea.  Respiratory: Negative for cough, shortness of breath and wheezing.   Gastrointestinal: Negative for nausea and vomiting.  Neurological: Negative for dizziness, light-headedness and weakness.    Physical Exam    VS:  BP 124/78 (BP Location: Right Arm, Patient Position: Sitting, Cuff Size: Normal)    Pulse 93    Ht 5\' 2"  (1.575 m)    Wt 180 lb (81.6 kg)    SpO2 96%    BMI 32.92 kg/m   , BMI Body mass index is 32.92 kg/m. GEN: Well nourished, overweight, well developed, in no acute distress. HEENT: Normal. Neck: Supple, no JVD, carotid bruits, or masses. Cardiac: RRR, no murmurs, rubs, or gallops. No clubbing, cyanosis, edema.  Radials/DP/PT 2+ and equal bilaterally.  Respiratory:  Respirations regular and unlabored, clear to auscultation bilaterally. GI: Soft, nontender, nondistended, BS + x 4. MS: No deformity or atrophy. Skin: Warm and dry, no rash. Neuro:  Strength and sensation are intact. Psych: Normal affect.  Accessory Clinical Findings    ECG personally  reviewed by me today- SR 93 bpm with no acute ST/T wave changes - No acute changes  Assessment & Plan   1. Palpitations - Onset approximately 1 year ago. Increased frequency recently. Occur at rest and with activity. Feels like a quick heart beat. Not associated with pain nor SOB. Self resolves after a few seconds or minutes. No noted aggravating or relieving factors. Reports increased stress. Does drink caffeine regularly. Drinks socially.   14 day ZIO placed today  Encouraged to avoid caffeine and alcohol as these can trigger palpitations.  Provided a list of pro-arrthymic medications to avoid.   Stress management techniques such as yoga, deep breathing, exercise suggested.  Obtain copy of recent labs from PCP. If labs do not include recent TSH, electrolytes will plan to draw at next office visit.   Will determine need for echocardiogram based on results of ZIO.  2. Overweight - Recommend weight loss through healthy diet and regular exercise.   3. Stress - Reports increased stress recently.We discussed that this could be the etiology of her palpitations.   Disposition: 14 day ZIO monitor placed in the office today. Follow up in 5 week(s) with Dr. Harriet Masson.  Loel Dubonnet, NP  07/25/2019, 4:54 PM

## 2019-07-25 NOTE — Patient Instructions (Addendum)
Medication Instructions:  No medication changes today.  *If you need a refill on your cardiac medications before your next appointment, please call your pharmacy*  Lab Work: No lab work today.  If you have labs (blood work) drawn today and your tests are completely normal, you will receive your results only by: Marland Kitchen MyChart Message (if you have MyChart) OR . A paper copy in the mail If you have any lab test that is abnormal or we need to change your treatment, we will call you to review the results.  Testing/Procedures: You had a ZIO monitor placed today. Please wear for 14 days and remove on 08/07/2019. You simply put it and the booklet in the box and place in your regular mailbox.   Follow-Up: At [facility], you and your health needs are our priority.  As part of our continuing mission to provide you with exceptional heart care, we have created designated Provider Care Teams.  These Care Teams include your primary Cardiologist (physician) and Advanced Practice Providers (APPs -  Physician Assistants and Nurse Practitioners) who all work together to provide you with the care you need, when you need it.  Your next appointment:   5 week(s)  The format for your next appointment:   In Person  Provider:   Berniece Salines, DO  Other Instructions  Very common causes of palpitations are stress, caffeine, and alcohol. Try to keep note of when your palpitations occur if you've had more caffeine recently, more stress, or alcohol.  Medications and palpitations 1. Avoid all over-the-counter antihistamines except Claritin/Loratadine and Zyrtec/Cetrizine. 2. Avoid all combination including cold sinus allergies flu decongestant and sleep medications 3. You can use Robitussin DM Mucinex and Mucinex DM for cough. 4. can use Tylenol aspirin ibuprofen and naproxen but no combinations such as sleep or sinus.  Palpitations Palpitations are feelings that your heartbeat is not normal. Your heartbeat may  feel like it is:  Uneven.  Faster than normal.  Fluttering.  Skipping a beat. This is usually not a serious problem. In some cases, you may need tests to rule out any serious problems. Follow these instructions at home: Pay attention to any changes in your condition. Take these actions to help manage your symptoms: Eating and drinking  Avoid: ? Coffee, tea, soft drinks, and energy drinks. ? Chocolate. ? Alcohol. ? Diet pills. Lifestyle   Try to lower your stress. These things can help you relax: ? Yoga. ? Deep breathing and meditation. ? Exercise. ? Using words and images to create positive thoughts (guided imagery). ? Using your mind to control things in your body (biofeedback).  Do not use drugs.  Get plenty of rest and sleep. Keep a regular bed time. General instructions   Take over-the-counter and prescription medicines only as told by your doctor.  Do not use any products that contain nicotine or tobacco, such as cigarettes and e-cigarettes. If you need help quitting, ask your doctor.  Keep all follow-up visits as told by your doctor. This is important. You may need more tests if palpitations do not go away or get worse. Contact a doctor if:  Your symptoms last more than 24 hours.  Your symptoms occur more often. Get help right away if you:  Have chest pain.  Feel short of breath.  Have a very bad headache.  Feel dizzy.  Pass out (faint). Summary  Palpitations are feelings that your heartbeat is uneven or faster than normal. It may feel like your heart is fluttering  or skipping a beat.  Avoid food and drinks that may cause palpitations. These include caffeine, chocolate, and alcohol.  Try to lower your stress. Do not smoke or use drugs.  Get help right away if you faint or have chest pain, shortness of breath, a severe headache, or dizziness. This information is not intended to replace advice given to you by your health care provider. Make sure  you discuss any questions you have with your health care provider. Document Released: 05/18/2008 Document Revised: 09/21/2017 Document Reviewed: 09/21/2017 Elsevier Patient Education  2020 [employer].

## 2019-08-06 ENCOUNTER — Ambulatory Visit: Payer: 59 | Admitting: Diagnostic Neuroimaging

## 2019-08-06 ENCOUNTER — Encounter: Payer: Self-pay | Admitting: *Deleted

## 2019-08-06 ENCOUNTER — Other Ambulatory Visit: Payer: Self-pay

## 2019-08-06 VITALS — BP 121/77 | HR 88 | Temp 97.2°F | Ht 62.5 in | Wt 181.2 lb

## 2019-08-06 DIAGNOSIS — R413 Other amnesia: Secondary | ICD-10-CM

## 2019-08-06 DIAGNOSIS — R42 Dizziness and giddiness: Secondary | ICD-10-CM | POA: Diagnosis not present

## 2019-08-06 DIAGNOSIS — R269 Unspecified abnormalities of gait and mobility: Secondary | ICD-10-CM

## 2019-08-06 MED ORDER — ALPRAZOLAM 0.5 MG PO TABS: ORAL_TABLET | ORAL | 0 refills | Status: DC

## 2019-08-06 NOTE — Progress Notes (Signed)
[facility]  PATIENT: Dana [name] DOB: [birth year]  REFERRING CLINICIAN: C White  HISTORY FROM: patient  REASON FOR VISIT: new consult    HISTORICAL  CHIEF COMPLAINT:  Chief Complaint  Patient presents with  . Dizziness    rm 7 New Pt, "always have been clumsy but gotten worse in past 2 years"  . Frequent Falls    HISTORY OF PRESENT ILLNESS:   48 year old female here for evaluation of dizziness and gait difficulty.  Patient reports at least 2 to 3 years of intermittent lightheadedness, dizziness, swimmy headedness, and intermittent falls.  Dizziness and falls are not always correlated.  Her falls tend to occur with steps, stairs, dark environments.  Sometimes her legs "give out".  She feels like she has a lot of ankle instability.  She injured her left ankle severely 2 years ago when she slipped on steps out of her home, at nighttime.  Also having issues with increased stress, insomnia, depression, difficulty with concentration and fatigue.   REVIEW OF SYSTEMS: Full 14 system review of systems performed and negative with exception of: As per HPI.  ALLERGIES: Allergies  Allergen Reactions  . Penicillins Rash  . Wellbutrin [Bupropion] Other (See Comments)    "Irritable"    HOME MEDICATIONS: Outpatient Medications Prior to Visit  Medication Sig Dispense Refill  . Cetirizine HCl (ZYRTEC ALLERGY PO) Take 1 tablet by mouth daily as needed. For allergy.    Marland Kitchen desvenlafaxine (PRISTIQ) 50 MG 24 hr tablet Take 1 tablet (50 mg total) by mouth daily. 30 tablet 6  . VRAYLAR capsule Take 1 capsule by mouth once daily (Patient taking differently: Take 1.5 mg by mouth daily. ) 30 capsule 5  . lamoTRIgine (LAMICTAL) 200 MG tablet Take as directed (Patient not taking: Reported on 07/25/2019) 90 tablet 0  . naproxen (NAPROSYN) 500 MG tablet Take 1 tablet (500 mg total) by mouth 2 (two) times daily. (Patient not taking: Reported on 07/25/2019) 30 tablet 0   No  facility-administered medications prior to visit.    PAST MEDICAL HISTORY: Past Medical History:  Diagnosis Date  . Anxiety   . Asthma   . CKD (chronic kidney disease), stage III   . Complication of anesthesia 2010   hard to wake up after colonoscopy, and increased HR  . Depression   . Frequent falls   . GERD (gastroesophageal reflux disease)    no meds  . H/O hiatal hernia   . Kyphosis   . Melanoma ([place]) 2010   left hip  . Migraine headache     PAST SURGICAL HISTORY: Past Surgical History:  Procedure Laterality Date  . CARPAL TUNNEL RELEASE    . CYSTOSCOPY  02/25/2012   Procedure: CYSTOSCOPY;  Surgeon: Margarette Asal, MD;  Location: [facility];  Service: Gynecology;  Laterality: N/A;  . PARTIAL HYSTERECTOMY    . PUBOVAGINAL SLING  02/25/2012   Procedure: Gaynelle Arabian;  Surgeon: Margarette Asal, MD;  Location: [facility];  Service: Gynecology;  Laterality: N/A;  . TONSILLECTOMY    . TUBAL LIGATION      FAMILY HISTORY: Family History  Problem Relation Age of Onset  . Pancreatic cancer Mother   . Hyperlipidemia Mother   . Stroke Mother   . Dementia Father   . Uterine cancer Maternal Grandmother   . Stroke Paternal Grandmother   . Diabetes Paternal Grandfather     SOCIAL HISTORY: Social History   Socioeconomic History  . Marital status: Married  Spouse name: Simona Huh  . Number of children: 2  . Years of education: Not on file  . Highest education level: Associate degree: academic program  Occupational History    Comment: Human relations  Tobacco Use  . Smoking status: Never Smoker  . Smokeless tobacco: Never Used  Substance and Sexual Activity  . Alcohol use: Yes    Comment: rare  . Drug use: No  . Sexual activity: Not on file  Other Topics Concern  . Not on file  Social History Narrative   Lives with spouse   Caffeine- 4 daily   Social Determinants of Health   Financial Resource Strain:   . Difficulty of Paying Living Expenses: Not on file    Food Insecurity:   . Worried About Charity fundraiser in the Last Year: Not on file  . Ran Out of Food in the Last Year: Not on file  Transportation Needs:   . Lack of Transportation (Medical): Not on file  . Lack of Transportation (Non-Medical): Not on file  Physical Activity:   . Days of Exercise per Week: Not on file  . Minutes of Exercise per Session: Not on file  Stress:   . Feeling of Stress : Not on file  Social Connections:   . Frequency of Communication with Friends and Family: Not on file  . Frequency of Social Gatherings with Friends and Family: Not on file  . Attends Religious Services: Not on file  . Active Member of Clubs or Organizations: Not on file  . Attends Archivist Meetings: Not on file  . Marital Status: Not on file  Intimate Partner Violence:   . Fear of Current or Ex-Partner: Not on file  . Emotionally Abused: Not on file  . Physically Abused: Not on file  . Sexually Abused: Not on file     PHYSICAL EXAM  GENERAL EXAM/CONSTITUTIONAL: Vitals:  Vitals:   08/06/19 1102  BP: 121/77  Pulse: 88  Temp: (!) 97.2 F (36.2 C)  Weight: 181 lb 3.2 oz (82.2 kg)  Height: 5' 2.5" (1.588 m)     Body mass index is 32.61 kg/m. Wt Readings from Last 3 Encounters:  08/06/19 181 lb 3.2 oz (82.2 kg)  07/25/19 180 lb (81.6 kg)  05/25/17 167 lb (75.8 kg)     Patient is in no distress; well developed, nourished and groomed; neck is supple  CARDIOVASCULAR:  Examination of carotid arteries is normal; no carotid bruits  Regular rate and rhythm, no murmurs  Examination of peripheral vascular system by observation and palpation is normal  EYES:  Ophthalmoscopic exam of optic discs and posterior segments is normal; no papilledema or hemorrhages  No exam data present  MUSCULOSKELETAL:  Gait, strength, tone, movements noted in Neurologic exam below  NEUROLOGIC: MENTAL STATUS:  No flowsheet data found.  awake, alert, oriented to person,  place and time  recent and remote memory intact  normal attention and concentration  language fluent, comprehension intact, naming intact  fund of knowledge appropriate  CRANIAL NERVE:   2nd - no papilledema on fundoscopic exam  2nd, 3rd, 4th, 6th - pupils equal and reactive to light, visual fields full to confrontation, extraocular muscles intact, no nystagmus  5th - facial sensation symmetric  7th - facial strength symmetric  8th - hearing intact  9th - palate elevates symmetrically, uvula midline  11th - shoulder shrug symmetric  12th - tongue protrusion midline  MOTOR:   normal bulk and tone, full strength in  the BUE, BLE  SENSORY:   normal and symmetric to light touch, temperature, vibration  COORDINATION:   finger-nose-finger, fine finger movements normal  REFLEXES:   deep tendon reflexes present and symmetric  GAIT/STATION:   narrow based gait; able to walk on toes, heels and tandem; romberg is negative     DIAGNOSTIC DATA (LABS, IMAGING, TESTING) - I reviewed patient records, labs, notes, testing and imaging myself where available.  Lab Results  Component Value Date   WBC 6.7 02/18/2012   HGB 13.0 02/18/2012   HCT 37.5 02/18/2012   MCV 87.0 02/18/2012   PLT 199 02/18/2012   No results found for: NA, K, CL, CO2, GLUCOSE, BUN, CREATININE, CALCIUM, PROT, ALBUMIN, AST, ALT, ALKPHOS, BILITOT, GFRNONAA, GFRAA No results found for: CHOL, HDL, LDLCALC, LDLDIRECT, TRIG, CHOLHDL No results found for: HGBA1C No results found for: VITAMINB12 No results found for: TSH   09/19/10 MRI cervical  1.  Minimal spondylosis as described. 2.  No significant disc herniation or focal stenosis.    ASSESSMENT AND PLAN  48 y.o. year old female here with gait and balance difficulty, memory loss, fatigue, dizziness, in the setting of insomnia, depression and increased stress in 2020.  Will check MRI of the brain to rule out any other secondary causes of  symptoms.  Dx:  1. Gait difficulty   2. Memory loss   3. Dizziness      PLAN:  INTERMITTENT DIZZINESS / LIGHTHEADEDNESS / GAIT DIFFICULTY / MEMORY LOSS / FATIGUE (may be related to insomnia, depression, stress; will check MRI to rule out other secondary causes) - check MRI brain w /wo - consider PT evaluation for for gait and balance training (patient wants to do these on her own for now)  Orders Placed This Encounter  Procedures  . MR BRAIN W WO CONTRAST   Meds ordered this encounter  Medications  . ALPRAZolam (XANAX) 0.5 MG tablet    Sig: for sedation before MRI scan; take 1 tab 1 hour before scan; may repeat 1 tab 15 min before scan    Dispense:  3 tablet    Refill:  0   Return for pending if symptoms worsen or fail to improve.    Penni Bombard, MD [id number], [id number] AM Certified in Neurology, Neurophysiology and Neuroimaging  [facility] [address], [place], [place] [address] [phone]

## 2019-08-06 NOTE — Patient Instructions (Signed)
-  check MRI brain   Thank you for coming to see [place] at [facility]. I hope we have been able to provide you high quality care today.  You may receive a patient satisfaction survey over the next few weeks. We would appreciate your feedback and comments so that we may continue to improve ourselves and the health of our patients.    ~~~~~~~~~~~~~~~~~~~~~~~~~~~~~~~~~~~~~~~~~~~~~~~~~~~~~~~~~~~~~~~~~  DR. Marya Lowden'S GUIDE TO HAPPY AND HEALTHY LIVING These are some of my general health and wellness recommendations. Some of them may apply to you better than others. Please use common sense as you try these suggestions and feel free to ask me any questions.   ACTIVITY/FITNESS Mental, social, emotional and physical stimulation are very important for brain and body health. Try learning a new activity (arts, music, language, sports, games).  Keep moving your body to the best of your abilities. You can do this at home, inside or outside, the park, community center, gym or anywhere you like. Consider a physical therapist or personal trainer to get started. Fitness trackers, smart-watches or  smart-phones can help as well.   NUTRITION Eat more plants: colorful vegetables, nuts, seeds and berries.  Eat less sugar, salt, preservatives and processed foods.  Avoid toxins such as cigarettes and alcohol.  Drink water when you are thirsty. Warm water with a slice of lemon is an excellent morning drink to start the day.  Consider these websites for more information The Nutrition Source (https://www.henry-hernandez.biz/) Precision Nutrition (WindowBlog.ch)   RELAXATION Consider practicing mindfulness meditation or other relaxation techniques such as deep breathing, prayer, yoga, tai chi, massage. See website mindful.org or the apps Headspace or Calm to help get started.   SLEEP Try to get at least 7-8+ hours sleep per day. Regular exercise  and reduced caffeine will help you sleep better. Practice good sleep hygeine techniques. See website sleep.org for more information.   PLANNING Prepare estate planning, living will, healthcare POA documents. Sometimes this is best planned with the help of an attorney. Theconversationproject.org and agingwithdignity.org are excellent resources.

## 2019-08-15 ENCOUNTER — Telehealth: Payer: Self-pay | Admitting: Diagnostic Neuroimaging

## 2019-08-15 NOTE — Telephone Encounter (Signed)
Patient called back due to missed call for her MRI.  Please follow up

## 2019-08-15 NOTE — Telephone Encounter (Signed)
I called again, patient did not answer I LVM. DW

## 2019-08-28 ENCOUNTER — Ambulatory Visit (INDEPENDENT_AMBULATORY_CARE_PROVIDER_SITE_OTHER): Payer: 59 | Admitting: Cardiology

## 2019-08-28 ENCOUNTER — Encounter: Payer: Self-pay | Admitting: Cardiology

## 2019-08-28 ENCOUNTER — Other Ambulatory Visit: Payer: Self-pay

## 2019-08-28 VITALS — BP 108/72 | HR 80 | Ht 62.5 in | Wt 181.0 lb

## 2019-08-28 DIAGNOSIS — I519 Heart disease, unspecified: Secondary | ICD-10-CM | POA: Diagnosis not present

## 2019-08-28 DIAGNOSIS — I5189 Other ill-defined heart diseases: Secondary | ICD-10-CM

## 2019-08-28 DIAGNOSIS — I493 Ventricular premature depolarization: Secondary | ICD-10-CM | POA: Diagnosis not present

## 2019-08-28 NOTE — Progress Notes (Signed)
Cardiology Office Note:    Date:  08/28/2019   ID:  Dana [name], DOB Dec 27, 1970, MRN [id number]  PCP:  Harlan Stains, MD  Cardiologist:  Berniece Salines, DO  Electrophysiologist:  None   Referring MD: Harlan Stains, MD   Follow-up visit  History of Present Illness:    Dana [name] is a 49 y.o. female with a hx of anxiety, CKD and depression presented initially on July 25, 2019 due to palpitations.  At conclusion of visit the patient had a ZIO monitor placed.  Past Medical History:  Diagnosis Date  . Anxiety   . Asthma   . CKD (chronic kidney disease), stage III   . Complication of anesthesia 2010   hard to wake up after colonoscopy, and increased HR  . Depression   . Frequent falls   . GERD (gastroesophageal reflux disease)    no meds  . H/O hiatal hernia   . Kyphosis   . Melanoma ([place]) 2010   left hip  . Migraine headache     Past Surgical History:  Procedure Laterality Date  . CARPAL TUNNEL RELEASE    . CYSTOSCOPY  02/25/2012   Procedure: CYSTOSCOPY;  Surgeon: Margarette Asal, MD;  Location: [facility];  Service: Gynecology;  Laterality: N/A;  . PARTIAL HYSTERECTOMY    . PUBOVAGINAL SLING  02/25/2012   Procedure: Gaynelle Arabian;  Surgeon: Margarette Asal, MD;  Location: [facility];  Service: Gynecology;  Laterality: N/A;  . TONSILLECTOMY    . TUBAL LIGATION      Current Medications: Current Meds  Medication Sig  . Cetirizine HCl (ZYRTEC ALLERGY PO) Take 1 tablet by mouth daily as needed. For allergy.  Marland Kitchen desvenlafaxine (PRISTIQ) 50 MG 24 hr tablet Take 50 mg by mouth daily.  Marland Kitchen VRAYLAR capsule Take 1 capsule by mouth once daily (Patient taking differently: Take 1.5 mg by mouth daily. )  . [DISCONTINUED] desvenlafaxine (PRISTIQ) 50 MG 24 hr tablet Take 1 tablet (50 mg total) by mouth daily.     Allergies:   Penicillins and Wellbutrin [bupropion]   Social History   Socioeconomic History  . Marital status: Married    Spouse name: Simona Huh  . Number of  children: 2  . Years of education: Not on file  . Highest education level: Associate degree: academic program  Occupational History    Comment: Human relations  Tobacco Use  . Smoking status: Never Smoker  . Smokeless tobacco: Never Used  Substance and Sexual Activity  . Alcohol use: Yes    Comment: rare  . Drug use: No  . Sexual activity: Not on file  Other Topics Concern  . Not on file  Social History Narrative   Lives with spouse   Caffeine- 4 daily   Social Determinants of Health   Financial Resource Strain:   . Difficulty of Paying Living Expenses: Not on file  Food Insecurity:   . Worried About Charity fundraiser in the Last Year: Not on file  . Ran Out of Food in the Last Year: Not on file  Transportation Needs:   . Lack of Transportation (Medical): Not on file  . Lack of Transportation (Non-Medical): Not on file  Physical Activity:   . Days of Exercise per Week: Not on file  . Minutes of Exercise per Session: Not on file  Stress:   . Feeling of Stress : Not on file  Social Connections:   . Frequency of Communication with Friends and Family: Not on  file  . Frequency of Social Gatherings with Friends and Family: Not on file  . Attends Religious Services: Not on file  . Active Member of Clubs or Organizations: Not on file  . Attends Archivist Meetings: Not on file  . Marital Status: Not on file     Family History: The patient's family history includes Dementia in her father; Diabetes in her paternal grandfather; Hyperlipidemia in her mother; Pancreatic cancer in her mother; Stroke in her mother and paternal grandmother; Uterine cancer in her maternal grandmother.  ROS:   Review of Systems  Constitution: Negative for decreased appetite, fever and weight gain.  HENT: Negative for congestion, ear discharge, hoarse voice and sore throat.   Eyes: Negative for discharge, redness, vision loss in right eye and visual halos.  Cardiovascular: Negative for  chest pain, dyspnea on exertion, leg swelling, orthopnea and palpitations.  Respiratory: Negative for cough, hemoptysis, shortness of breath and snoring.   Endocrine: Negative for heat intolerance and polyphagia.  Hematologic/Lymphatic: Negative for bleeding problem. Does not bruise/bleed easily.  Skin: Negative for flushing, nail changes, rash and suspicious lesions.  Musculoskeletal: Negative for arthritis, joint pain, muscle cramps, myalgias, neck pain and stiffness.  Gastrointestinal: Negative for abdominal pain, bowel incontinence, diarrhea and excessive appetite.  Genitourinary: Negative for decreased libido, genital sores and incomplete emptying.  Neurological: Negative for brief paralysis, focal weakness, headaches and loss of balance.  Psychiatric/Behavioral: Negative for altered mental status, depression and suicidal ideas.  Allergic/Immunologic: Negative for HIV exposure and persistent infections.    EKGs/Labs/Other Studies Reviewed:    The following studies were reviewed today:   EKG:  None today.  Transthoracic echocardiogram in October 2018 - Left ventricle: The cavity size was normal. Wall thickness was   normal. Systolic function was normal. The estimated ejection   fraction was in the range of 60% to 65%. Wall motion was normal;   there were no regional wall motion abnormalities. Features are   consistent with a pseudonormal left ventricular filling pattern,   with concomitant abnormal relaxation and increased filling   pressure (grade 2 diastolic dysfunction).  Treadmill stress test Result status: Final result    Blood pressure demonstrated a normal response to exercise.  There was no ST segment deviation noted during stress.   1. Average exercise tolerance.  2. No evidence for ischemia by ST segment analysis.     ZIO monitor preliminary report reviewed by me the patient wore the monitor for 11 days 14 hours indications for palpitations. Heart rate 158,  minimum heart rate 56, average heart rate 90.  The predominant rhythm was sinus the patient had occasional premature ventricular complex (1.2%, 17989). No ventricular tachycardia, no pauses, no AV blocks or atrial fibrillation noted.  Recent Labs: No results found for requested labs within last 8760 hours.  Recent Lipid Panel No results found for: CHOL, TRIG, HDL, CHOLHDL, VLDL, LDLCALC, LDLDIRECT  Physical Exam:    VS:  BP 108/72   Pulse 80   Ht 5' 2.5" (1.588 m)   Wt 181 lb (82.1 kg)   SpO2 95%   BMI 32.58 kg/m     Wt Readings from Last 3 Encounters:  08/28/19 181 lb (82.1 kg)  08/06/19 181 lb 3.2 oz (82.2 kg)  07/25/19 180 lb (81.6 kg)     GEN: Well nourished, well developed in no acute distress HEENT: Normal NECK: No JVD; No carotid bruits LYMPHATICS: No lymphadenopathy CARDIAC: S1S2 noted,RRR, no murmurs, rubs, gallops RESPIRATORY:  Clear to  auscultation without rales, wheezing or rhonchi  ABDOMEN: Soft, non-tender, non-distended, +bowel sounds, no guarding. EXTREMITIES: No edema, No cyanosis, no clubbing MUSCULOSKELETAL:  No edema; No deformity  SKIN: Warm and dry NEUROLOGIC:  Alert and oriented x 3, non-focal PSYCHIATRIC:  Normal affect, good insight  ASSESSMENT:    1. Asymptomatic PVCs    PLAN:      1.  I discussed with the patient the findings of her ZIO monitor which revealed occasional asymptomatic premature ventricular complexes (1.2%).  She has not had any palpitations since her visit.  I have discussed with the patient and we will continue to monitor her symptoms.  No need for starting any medications at this time.  2.  2D echo performed in 2018 review showed evidence of grade 2 diastolic dysfunction.  The patient is in agreement with the above plan. The patient left the office in stable condition.  The patient will follow up in as needed.   Medication Adjustments/Labs and Tests Ordered: Current medicines are reviewed at length with the patient today.   Concerns regarding medicines are outlined above.  No orders of the defined types were placed in this encounter.  No orders of the defined types were placed in this encounter.   Patient Instructions  Your physician recommends that you continue on your current medications as directed. Please refer to the Current Medication list given to you today.  *If you need a refill on your cardiac medications before your next appointment, please call your pharmacy*  Lab Work: NOne If you have labs (blood work) drawn today and your tests are completely normal, you will receive your results only by: Marland Kitchen MyChart Message (if you have MyChart) OR . A paper copy in the mail If you have any lab test that is abnormal or we need to change your treatment, we will call you to review the results.  Testing/Procedures: NOne  Follow-Up: At [facility], you and your health needs are our priority.  As part of our continuing mission to provide you with exceptional heart care, we have created designated Provider Care Teams.  These Care Teams include your primary Cardiologist (physician) and Advanced Practice Providers (APPs -  Physician Assistants and Nurse Practitioners) who all work together to provide you with the care you need, when you need it.  Your next appointment:    AS NEEDED  The format for your next appointment:   In Person  Provider:   Berniece Salines, DO  Other Instructions       Adopting a Healthy Lifestyle.  Know what a healthy weight is for you (roughly BMI <25) and aim to maintain this   Aim for 7+ servings of fruits and vegetables daily   65-80+ fluid ounces of water or unsweet tea for healthy kidneys   Limit to max 1 drink of alcohol per day; avoid smoking/tobacco   Limit animal fats in diet for cholesterol and heart health - choose grass fed whenever available   Avoid highly processed foods, and foods high in saturated/trans fats   Aim for low stress - take time to unwind and  care for your mental health   Aim for 150 min of moderate intensity exercise weekly for heart health, and weights twice weekly for bone health   Aim for 7-9 hours of sleep daily   When it comes to diets, agreement about the perfect plan isnt easy to find, even among the experts. Experts at the [employer] developed an idea known  as the Healthy Eating Plate. Just imagine a plate divided into logical, healthy portions.   The emphasis is on diet quality:   Load up on vegetables and fruits - one-half of your plate: Aim for color and variety, and remember that potatoes dont count.   Go for whole grains - one-quarter of your plate: Whole wheat, barley, wheat berries, quinoa, oats, brown rice, and foods made with them. If you want pasta, go with whole wheat pasta.   Protein power - one-quarter of your plate: Fish, chicken, beans, and nuts are all healthy, versatile protein sources. Limit red meat.   The diet, however, does go beyond the plate, offering a few other suggestions.   Use healthy plant oils, such as olive, canola, soy, corn, sunflower and peanut. Check the labels, and avoid partially hydrogenated oil, which have unhealthy trans fats.   If youre thirsty, drink water. Coffee and tea are good in moderation, but skip sugary drinks and limit milk and dairy products to one or two daily servings.   The type of carbohydrate in the diet is more important than the amount. Some sources of carbohydrates, such as vegetables, fruits, whole grains, and beans-are healthier than others.   Finally, stay active  Signed, Berniece Salines, DO  08/28/2019 8:44 AM    [place]

## 2019-08-28 NOTE — Patient Instructions (Signed)
Your physician recommends that you continue on your current medications as directed. Please refer to the Current Medication list given to you today.  *If you need a refill on your cardiac medications before your next appointment, please call your pharmacy*  Lab Work: NOne If you have labs (blood work) drawn today and your tests are completely normal, you will receive your results only by: Marland Kitchen MyChart Message (if you have MyChart) OR . A paper copy in the mail If you have any lab test that is abnormal or we need to change your treatment, we will call you to review the results.  Testing/Procedures: NOne  Follow-Up: At [facility], you and your health needs are our priority.  As part of our continuing mission to provide you with exceptional heart care, we have created designated Provider Care Teams.  These Care Teams include your primary Cardiologist (physician) and Advanced Practice Providers (APPs -  Physician Assistants and Nurse Practitioners) who all work together to provide you with the care you need, when you need it.  Your next appointment:    AS NEEDED  The format for your next appointment:   In Person  Provider:   Berniece Salines, DO  Other Instructions

## 2019-09-04 ENCOUNTER — Other Ambulatory Visit: Payer: Self-pay

## 2019-09-04 ENCOUNTER — Ambulatory Visit: Payer: 59

## 2019-09-04 ENCOUNTER — Other Ambulatory Visit: Payer: Self-pay | Admitting: Diagnostic Neuroimaging

## 2019-09-04 DIAGNOSIS — R269 Unspecified abnormalities of gait and mobility: Secondary | ICD-10-CM

## 2019-09-04 DIAGNOSIS — R42 Dizziness and giddiness: Secondary | ICD-10-CM

## 2019-09-04 DIAGNOSIS — R413 Other amnesia: Secondary | ICD-10-CM | POA: Diagnosis not present

## 2019-09-05 ENCOUNTER — Telehealth: Payer: Self-pay | Admitting: *Deleted

## 2019-09-05 NOTE — Telephone Encounter (Signed)
Spoke with patient and informed her that her MRI brain result is normal. Advised she monitor symptoms, FU with PCP and call us for questions. Patient verbalized understanding, appreciation.

## 2019-09-17 ENCOUNTER — Other Ambulatory Visit: Payer: Self-pay

## 2019-09-17 MED ORDER — DESVENLAFAXINE SUCCINATE ER 50 MG PO TB24: 50.0000 mg | ORAL_TABLET | Freq: Every day | ORAL | 1 refills | Status: DC

## 2019-10-02 ENCOUNTER — Encounter: Payer: Self-pay | Admitting: Psychiatry

## 2019-10-02 ENCOUNTER — Other Ambulatory Visit: Payer: Self-pay

## 2019-10-02 ENCOUNTER — Ambulatory Visit (INDEPENDENT_AMBULATORY_CARE_PROVIDER_SITE_OTHER): Payer: 59 | Admitting: Psychiatry

## 2019-10-02 DIAGNOSIS — F3181 Bipolar II disorder: Secondary | ICD-10-CM | POA: Diagnosis not present

## 2019-10-02 DIAGNOSIS — R5382 Chronic fatigue, unspecified: Secondary | ICD-10-CM

## 2019-10-02 DIAGNOSIS — F411 Generalized anxiety disorder: Secondary | ICD-10-CM | POA: Diagnosis not present

## 2019-10-02 MED ORDER — CARIPRAZINE HCL 1.5 MG PO CAPS: 1.5000 mg | ORAL_CAPSULE | Freq: Every day | ORAL | 5 refills | Status: DC

## 2019-10-02 NOTE — Patient Instructions (Addendum)
Pristiq 25 mg daily for 4 to 6 weeks and if no worsening depression or anxiety then stop it.  If there is excessive dizziness or any other problems after reducing the dose then call  Ask husband if suspected sleep apnea.

## 2019-10-02 NOTE — Progress Notes (Signed)
OLWEN RUGG [id number] 11/14/1970 49 y.o.  Subjective:   Patient ID:  Dana [name] is a 49 y.o. (DOB Mar 17, 1971) female.  Chief Complaint:  Chief Complaint  Patient presents with  . Follow-up    Medication Management  . Other    Bipolar 2  . Medication Refill    Pristiq 50 Mg    Medication Refill Associated symptoms include fatigue. Pertinent negatives include no weakness.   Dana [name] presents to the office today for follow-up of bipolar disorder type II and generalized anxiety disorder and history of panic attacks.  Last seen August 2020.  Her symptoms were in remission with the addition of Vraylar 1.5 mg to Pristiq.  She was continued on Vraylar and Pristiq and lamotrigine was tapered as it was felt to be no longer necessary given the benefit with Vraylar.  She was encouraged to contact us if she had any recurrence.  She wanted to try to reduce medications if possible.  Doing good.  No problems off the lamotrigine.  Still having dizzy spells off it.  Notice is more in the morning but can occur other times of days.  Probably not enough fluids.  No skipped doses of Pristiq.  Still low energy to do much and is a concern.  Normal thyroid check.    Current meds since 2016 when Vraylar added and Pristiq 50 mg reduced and lamotrigine increased.  Quite a few depressive episodes.   Patient reports stable mood and denies depressed or irritable moods.  Patient denies any recent difficulty with anxiety.  Patient denies difficulty with sleep initiation or maintenance.  Wants to sleep a lot.  9 pm to 7:30 AM.  H says snore some.. Denies appetite disturbance.  Patient reports that energy poor for 2-3 year and motivation not good either.  Patient has some difficulty with concentration including at work.  Alertness OK daytime.  Patient denies any suicidal ideation. Last panic in a long while.   Caffeine reduced to 1 large cup AM. Alcohol 1 daily.  Past Psychiatric Medication  Trials: Lamotrigine 200, Pristiq 100 mg, Vraylar 3 dizzy, sertraline, fluoxetine, buspirone, Wellbutrin, Abilify   Review of Systems:  Review of Systems  Constitutional: Positive for fatigue and unexpected weight change.  Neurological: Positive for dizziness. Negative for tremors and weakness.    Medications: I have reviewed the patient's current medications.  Current Outpatient Medications  Medication Sig Dispense Refill  . cariprazine (VRAYLAR) capsule Take 1 capsule (1.5 mg total) by mouth daily. 30 capsule 5  . Cetirizine HCl (ZYRTEC ALLERGY PO) Take 1 tablet by mouth daily as needed. For allergy.    . cholecalciferol (VITAMIN D3) 25 MCG (1000 UNIT) tablet Take 1,000 Units by mouth daily.    . TURMERIC PO Take by mouth.    . vitamin B-12 (CYANOCOBALAMIN) 1000 MCG tablet Take 1,000 mcg by mouth daily.     No current facility-administered medications for this visit.    Medication Side Effects: None  Allergies:  Allergies  Allergen Reactions  . Penicillins Rash  . Wellbutrin [Bupropion] Other (See Comments)    "Irritable"    Past Medical History:  Diagnosis Date  . Anxiety   . Asthma   . CKD (chronic kidney disease), stage III   . Complication of anesthesia 2010   hard to wake up after colonoscopy, and increased HR  . Depression   . Frequent falls   . GERD (gastroesophageal reflux disease)    no meds  . H/O  hiatal hernia   . Kyphosis   . Melanoma ([place]) 2010   left hip  . Migraine headache     Family History  Problem Relation Age of Onset  . Pancreatic cancer Mother   . Hyperlipidemia Mother   . Stroke Mother   . Dementia Father   . Uterine cancer Maternal Grandmother   . Stroke Paternal Grandmother   . Diabetes Paternal Grandfather     Social History   Socioeconomic History  . Marital status: Married    Spouse name: Simona Huh  . Number of children: 2  . Years of education: Not on file  . Highest education level: Associate degree: academic program   Occupational History    Comment: Human relations  Tobacco Use  . Smoking status: Never Smoker  . Smokeless tobacco: Never Used  Substance and Sexual Activity  . Alcohol use: Yes    Comment: rare  . Drug use: No  . Sexual activity: Not on file  Other Topics Concern  . Not on file  Social History Narrative   Lives with spouse   Caffeine- 4 daily   Social Determinants of Health   Financial Resource Strain:   . Difficulty of Paying Living Expenses: Not on file  Food Insecurity:   . Worried About Charity fundraiser in the Last Year: Not on file  . Ran Out of Food in the Last Year: Not on file  Transportation Needs:   . Lack of Transportation (Medical): Not on file  . Lack of Transportation (Non-Medical): Not on file  Physical Activity:   . Days of Exercise per Week: Not on file  . Minutes of Exercise per Session: Not on file  Stress:   . Feeling of Stress : Not on file  Social Connections:   . Frequency of Communication with Friends and Family: Not on file  . Frequency of Social Gatherings with Friends and Family: Not on file  . Attends Religious Services: Not on file  . Active Member of Clubs or Organizations: Not on file  . Attends Archivist Meetings: Not on file  . Marital Status: Not on file  Intimate Partner Violence:   . Fear of Current or Ex-Partner: Not on file  . Emotionally Abused: Not on file  . Physically Abused: Not on file  . Sexually Abused: Not on file    Past Medical History, Surgical history, Social history, and Family history were reviewed and updated as appropriate.   Please see review of systems for further details on the patient's review from today.   Objective:   Physical Exam:  There were no vitals taken for this visit.  Physical Exam Constitutional:      General: She is not in acute distress.    Appearance: She is well-developed. She is obese.  Musculoskeletal:        General: No deformity.  Neurological:     Mental  Status: She is alert and oriented to person, place, and time.     Coordination: Coordination normal.  Psychiatric:        Attention and Perception: Attention and perception normal. She does not perceive auditory or visual hallucinations.        Mood and Affect: Mood normal. Mood is not anxious or depressed. Affect is not labile, blunt, angry or inappropriate.        Speech: Speech normal.        Behavior: Behavior normal.        Thought Content: Thought content normal.  Thought content is not paranoid or delusional. Thought content does not include homicidal or suicidal ideation. Thought content does not include homicidal or suicidal plan.        Cognition and Memory: Cognition and memory normal.        Judgment: Judgment normal.     Comments: Insight intact     Lab Review:  No results found for: NA, K, CL, CO2, GLUCOSE, BUN, CREATININE, CALCIUM, PROT, ALBUMIN, AST, ALT, ALKPHOS, BILITOT, GFRNONAA, GFRAA     Component Value Date/Time   WBC 6.7 02/18/2012 1130   RBC 4.31 02/18/2012 1130   HGB 13.0 02/18/2012 1130   HCT 37.5 02/18/2012 1130   PLT 199 02/18/2012 1130   MCV 87.0 02/18/2012 1130   MCH 30.2 02/18/2012 1130   MCHC 34.7 02/18/2012 1130   RDW 13.1 02/18/2012 1130    No results found for: POCLITH, LITHIUM   No results found for: PHENYTOIN, PHENOBARB, VALPROATE, CBMZ   .res Assessment: Plan:    Doresa was seen today for follow-up, other and medication refill.  Diagnoses and all orders for this visit:  Bipolar II disorder ([place]) -     cariprazine (VRAYLAR) capsule; Take 1 capsule (1.5 mg total) by mouth daily.  Generalized anxiety disorder  Chronic fatigue      Greater than 50% of30 min face to face time with patient was spent on counseling and coordination of care. We discussed She admits a history of multiple depressive episodes.  She has been seen in our office since 2007.  There has been debate about whether she has an irritable depression or bipolar type II.   The decision was made to change her diagnosis to bipolar type II in part based on reactions to medication.  No change in diagnosis was made in 2006.  She had problems with irritability on Pristiq 100 mg daily.  Symptoms went into remission with the addition of Vraylar 1.5 mg at that time and the reduction in Pristiq.  Her symptoms have remained in good control since then.  It was discussed with her that because of the number of depressive episode she has had in her lifetime it is highly likely she will relapse without maintenance medication.  She has had good response with the Vraylar and we will expect to continue that medication.  She is planning to have a work-up for chronic fatigue symptoms with other primary care doctor soon.  If that is not successful we could consider modafinil and she can let us know if that is necessary.    She would like to try to taper Pristiq and see if she does well on just the Vraylar alone.  Also to evaluate whether or not the Pristiq may be contributing to her chronic fatigue symptoms.  Discussed the risk of relapse off that medication.  Disc sleep apnea possibility. Rec discuss with H Dt chronic sleepiness and fatigue.  Continue Vraylar 1.5 mg and Reduc Pristiq 25 mg daily to see if energy is better and see if it's necessary. After 4-6 weeks if ok stop it.  Disc SE in detail and SSRI withdrawal sx. [name] if depression or anxiety worsen.  Discussed potential metabolic side effects associated with atypical antipsychotics, as well as potential risk for movement side effects. Advised pt to contact office if movement side effects occur.   Expect stability so follow-up in 6 months.  [name] as needed.  This appt was 30 mins.  Lynder Parents MD, DFAPA  Please see After Visit Summary for  patient specific instructions.  Future Appointments  Date Time Provider [place]  04/02/2020  9:00 AM Cottle, Billey Co., MD [facility] None    No orders of the defined types were  placed in this encounter.   -------------------------------

## 2019-11-12 ENCOUNTER — Telehealth: Payer: Self-pay | Admitting: Psychiatry

## 2019-11-12 ENCOUNTER — Other Ambulatory Visit: Payer: Self-pay

## 2019-11-12 MED ORDER — DESVENLAFAXINE SUCCINATE ER 50 MG PO TB24: 50.0000 mg | ORAL_TABLET | Freq: Every day | ORAL | 0 refills | Status: DC

## 2019-11-12 NOTE — Telephone Encounter (Signed)
Pt called this morning and stated she no longer wants to continue talking VRAYLAR. She feels it is not helping. She would like to go back on medications for anxiety and depression. Her anxiety has gotten worse. She would like a call back from the nurse if possible.  Call back # [phone]

## 2019-11-12 NOTE — Telephone Encounter (Signed)
Called pt back to make appt, unable to leave a VM.

## 2019-11-12 NOTE — Telephone Encounter (Signed)
The last change we made was to wean Pristiq to see if her energy was better.  If she wants to go back on Pristiq 50 mg daily that she could do that.  It would be dangerous to stop Vraylar without some other mood stabilizer.  If she wants to do something other than just add Pristiq back she needs to come in for an appointment so we can discuss what the options are.

## 2019-11-12 NOTE — Telephone Encounter (Signed)
Patient called back and given instructions she can restart Pristiq 50 mg but would need an apt to change Vraylar to something else. She agreed and set up a new apt. She's aware to continue Vraylar. Rx for Pristiq 50 mg 1 daily #30, no refills submitted to her pharmacy.

## 2019-11-12 NOTE — Telephone Encounter (Signed)
Left message to call back to discuss.

## 2019-11-16 ENCOUNTER — Ambulatory Visit: Payer: 59 | Admitting: Psychiatry

## 2019-11-28 ENCOUNTER — Encounter: Payer: Self-pay | Admitting: Psychiatry

## 2019-11-28 ENCOUNTER — Other Ambulatory Visit: Payer: Self-pay

## 2019-11-28 ENCOUNTER — Ambulatory Visit (INDEPENDENT_AMBULATORY_CARE_PROVIDER_SITE_OTHER): Payer: 59 | Admitting: Psychiatry

## 2019-11-28 DIAGNOSIS — R5382 Chronic fatigue, unspecified: Secondary | ICD-10-CM | POA: Diagnosis not present

## 2019-11-28 DIAGNOSIS — F411 Generalized anxiety disorder: Secondary | ICD-10-CM | POA: Diagnosis not present

## 2019-11-28 DIAGNOSIS — F3181 Bipolar II disorder: Secondary | ICD-10-CM

## 2019-11-28 MED ORDER — FLUOXETINE HCL 10 MG PO CAPS: 10.0000 mg | ORAL_CAPSULE | Freq: Every day | ORAL | 0 refills | Status: DC

## 2019-11-28 NOTE — Progress Notes (Signed)
NISAA SUCHER [id number] 1971-06-07 49 y.o.  Subjective:   Patient ID:  Dana [name] is a 49 y.o. (DOB [birth year]) female.  Chief Complaint:  Chief Complaint  Patient presents with  . Medication Problem    tired  . Depression  . Anxiety    Medication Refill Associated symptoms include diaphoresis and fatigue. Pertinent negatives include no weakness.   Dana [name] presents to the office today for follow-up of bipolar disorder type II and generalized anxiety disorder and history of panic attacks.  seen August 2020.  Her symptoms were in remission with the addition of Vraylar 1.5 mg to Pristiq.  She was continued on Vraylar and Pristiq and lamotrigine was tapered as it was felt to be no longer necessary given the benefit with Vraylar.  She was encouraged to contact us if she had any recurrence.  She wanted to try to reduce medications if possible.  Last seen October 02, 2019.  She reported:  Doing good.  No problems off the lamotrigine.  Still having dizzy spells off it.  Notice is more in the morning but can occur other times of days.  Probably not enough fluids.  No skipped doses of Pristiq.  Still low energy to do much and is a concern.  Normal thyroid check.  We decided on the following med change: Continue Vraylar 1.5 mg and Reduc Pristiq 25 mg daily to see if energy is better and see if it's necessary. After 4-6 weeks if ok stop it.  She called November 12, 2019 and left a message she would like to stop Vraylar because she felt like it was not helping.  She was encouraged to schedule an appointment.  As of November 28, 2019: Fine on 25 mg Pristiq but when stopped depression and anxiety and dizziness but less tired.  Restarted Pristiq and is tired again and focus is not quite as good.  Insurance on Pristiq is higher.    Current meds since 2016 when Vraylar added and Pristiq 50 mg reduced and lamotrigine increased.  Quite a few depressive episodes.   Patient reports stable  mood and denies depressed or irritable moods.  Patient denies any recent difficulty with anxiety.  Patient denies difficulty with sleep initiation or maintenance.  Wants to sleep a lot.  9 pm to 7:30 AM.  H says snore some.. Denies appetite disturbance.  Patient reports that energy poor for 2-3 year and motivation not good either.  Patient has some difficulty with concentration including at work.  Alertness OK daytime.  Patient denies any suicidal ideation. Last panic in a long while.   Caffeine reduced to 1 large cup AM. Alcohol 1 daily.  Past Psychiatric Medication Trials: Lamotrigine 200, Pristiq 100 mg, Vraylar 3 dizzy, Abilify  sertraline, fluoxetine, buspirone, Wellbutrin irritable,   Review of Systems:  Review of Systems  Constitutional: Positive for diaphoresis, fatigue and unexpected weight change.  Neurological: Negative for tremors and weakness.    Medications: I have reviewed the patient's current medications.  Current Outpatient Medications  Medication Sig Dispense Refill  . cariprazine (VRAYLAR) capsule Take 1 capsule (1.5 mg total) by mouth daily. 30 capsule 5  . Cetirizine HCl (ZYRTEC ALLERGY PO) Take 1 tablet by mouth daily as needed. For allergy.    . cholecalciferol (VITAMIN D3) 25 MCG (1000 UNIT) tablet Take 1,000 Units by mouth daily.    Marland Kitchen desvenlafaxine (PRISTIQ) 50 MG 24 hr tablet Take 1 tablet (50 mg total) by mouth daily. 30 tablet 0  .  TURMERIC PO Take by mouth.    . vitamin B-12 (CYANOCOBALAMIN) 1000 MCG tablet Take 1,000 mcg by mouth daily.     No current facility-administered medications for this visit.    Medication Side Effects: None  Allergies:  Allergies  Allergen Reactions  . Penicillins Rash  . Wellbutrin [Bupropion] Other (See Comments)    "Irritable"    Past Medical History:  Diagnosis Date  . Anxiety   . Asthma   . CKD (chronic kidney disease), stage III   . Complication of anesthesia 2010   hard to wake up after colonoscopy, and  increased HR  . Depression   . Frequent falls   . GERD (gastroesophageal reflux disease)    no meds  . H/O hiatal hernia   . Kyphosis   . Melanoma ([place]) 2010   left hip  . Migraine headache     Family History  Problem Relation Age of Onset  . Pancreatic cancer Mother   . Hyperlipidemia Mother   . Stroke Mother   . Dementia Father   . Uterine cancer Maternal Grandmother   . Stroke Paternal Grandmother   . Diabetes Paternal Grandfather     Social History   Socioeconomic History  . Marital status: Married    Spouse name: Simona Huh  . Number of children: 2  . Years of education: Not on file  . Highest education level: Associate degree: academic program  Occupational History    Comment: Human relations  Tobacco Use  . Smoking status: Never Smoker  . Smokeless tobacco: Never Used  Substance and Sexual Activity  . Alcohol use: Yes    Comment: rare  . Drug use: No  . Sexual activity: Not on file  Other Topics Concern  . Not on file  Social History Narrative   Lives with spouse   Caffeine- 4 daily   Social Determinants of Health   Financial Resource Strain:   . Difficulty of Paying Living Expenses:   Food Insecurity:   . Worried About Charity fundraiser in the Last Year:   . Arboriculturist in the Last Year:   Transportation Needs:   . Film/video editor (Medical):   Marland Kitchen Lack of Transportation (Non-Medical):   Physical Activity:   . Days of Exercise per Week:   . Minutes of Exercise per Session:   Stress:   . Feeling of Stress :   Social Connections:   . Frequency of Communication with Friends and Family:   . Frequency of Social Gatherings with Friends and Family:   . Attends Religious Services:   . Active Member of Clubs or Organizations:   . Attends Archivist Meetings:   Marland Kitchen Marital Status:   Intimate Partner Violence:   . Fear of Current or Ex-Partner:   . Emotionally Abused:   Marland Kitchen Physically Abused:   . Sexually Abused:     Past Medical  History, Surgical history, Social history, and Family history were reviewed and updated as appropriate.   Please see review of systems for further details on the patient's review from today.   Objective:   Physical Exam:  There were no vitals taken for this visit.  Physical Exam Constitutional:      General: She is not in acute distress.    Appearance: She is well-developed. She is obese.  Musculoskeletal:        General: No deformity.  Neurological:     Mental Status: She is alert and oriented to person, place,  and time.     Coordination: Coordination normal.  Psychiatric:        Attention and Perception: Attention and perception normal. She does not perceive auditory or visual hallucinations.        Mood and Affect: Mood normal. Mood is not anxious or depressed. Affect is not labile, blunt, angry or inappropriate.        Speech: Speech normal.        Behavior: Behavior normal.        Thought Content: Thought content normal. Thought content is not paranoid or delusional. Thought content does not include homicidal or suicidal ideation. Thought content does not include homicidal or suicidal plan.        Cognition and Memory: Cognition and memory normal.        Judgment: Judgment normal.     Comments: Insight intact     Lab Review:  No results found for: NA, K, CL, CO2, GLUCOSE, BUN, CREATININE, CALCIUM, PROT, ALBUMIN, AST, ALT, ALKPHOS, BILITOT, GFRNONAA, GFRAA     Component Value Date/Time   WBC 6.7 02/18/2012 1130   RBC 4.31 02/18/2012 1130   HGB 13.0 02/18/2012 1130   HCT 37.5 02/18/2012 1130   PLT 199 02/18/2012 1130   MCV 87.0 02/18/2012 1130   MCH 30.2 02/18/2012 1130   MCHC 34.7 02/18/2012 1130   RDW 13.1 02/18/2012 1130    No results found for: POCLITH, LITHIUM   No results found for: PHENYTOIN, PHENOBARB, VALPROATE, CBMZ   .res Assessment: Plan:    Lun was seen today for medication problem, depression and anxiety.  Diagnoses and all orders for this  visit:  Bipolar II disorder ([place])  Generalized anxiety disorder  Chronic fatigue      Greater than 50% of30 min face to face time with patient was spent on counseling and coordination of care. We discussed She admits a history of multiple depressive episodes.  She has been seen in our office since 2007.  There has been debate about whether she has an irritable depression or bipolar type II.  The decision was made to change her diagnosis to bipolar type II in part based on reactions to medication.  No change in diagnosis was made in 2006.  She had problems with irritability on Pristiq 100 mg daily.  Symptoms went into remission with the addition of Vraylar 1.5 mg at that time and the reduction in Pristiq.  Her symptoms have remained in good control since then.  It was discussed with her that because of the number of depressive episode she has had in her lifetime it is highly likely she will relapse without maintenance medication.  She has had good response with the Vraylar and we will expect to continue that medication.  She had an improvement in energy level off of Pristiq but a relapse of depression and anxiety as well as SSRI withdrawal.  The depression and anxiety have resolved back on Pristiq but she is having fatigue on it again.  She wants to switch off Pristiq.  We discussed the option of Wellbutrin.  When she took it alone she had irritability.  We discussed the bipolar this disorder diagnosis and how antidepressants alone will not be successful.  However sometimes antidepressants are successful when given with a mood stabilizer such as the case currently.  We could reconsider Wellbutrin with Vraylar.  However she would likely have SSRI withdrawal with the transition.  Instead we elected to go with fluoxetine which will prevent withdrawal and is generally  low in risk of fatigue.  We discussed extensively the risk of relapse as well as side effects and the risk of worsening mood cycling.  She is  planning to have a work-up for chronic fatigue symptoms with other primary care doctor soon.  If that is not successful we could consider modafinil and she can let us know if that is necessary.    Disc sleep apnea possibility. Rec discuss with H Dt chronic sleepiness and fatigue.  Continue Vraylar 1.5 mg and Switch to fluoxetine 10 in hopes of less tiredness.  Disc SE in detail and SSRI withdrawal sx. [name] if depression or anxiety worsen.  Disc SE in detail and SSRI withdrawal sx.  Discussed potential metabolic side effects associated with atypical antipsychotics, as well as potential risk for movement side effects. Advised pt to contact office if movement side effects occur.   FU 6 weeks  This appt was 30 mins.  Lynder Parents MD, DFAPA  Please see After Visit Summary for patient specific instructions.  Future Appointments  Date Time Provider [place]  04/02/2020  9:00 AM Cottle, Billey Co., MD [facility] None    No orders of the defined types were placed in this encounter.   -------------------------------

## 2019-12-17 ENCOUNTER — Ambulatory Visit: Payer: 59 | Admitting: Psychiatry

## 2020-01-09 ENCOUNTER — Other Ambulatory Visit: Payer: Self-pay

## 2020-01-09 ENCOUNTER — Ambulatory Visit (INDEPENDENT_AMBULATORY_CARE_PROVIDER_SITE_OTHER): Payer: 59 | Admitting: Psychiatry

## 2020-01-09 ENCOUNTER — Encounter: Payer: Self-pay | Admitting: Psychiatry

## 2020-01-09 DIAGNOSIS — R5382 Chronic fatigue, unspecified: Secondary | ICD-10-CM | POA: Diagnosis not present

## 2020-01-09 DIAGNOSIS — F411 Generalized anxiety disorder: Secondary | ICD-10-CM | POA: Diagnosis not present

## 2020-01-09 DIAGNOSIS — F3181 Bipolar II disorder: Secondary | ICD-10-CM

## 2020-01-09 NOTE — Progress Notes (Signed)
Dana [name] [id number] May 13, 1971 49 y.o.  Subjective:   Patient ID:  Dana [name] is a 49 y.o. (DOB June 24, 1971) female.  Chief Complaint:  Chief Complaint  Patient presents with  . Anxiety  . Follow-up    med changes    Medication Refill Associated symptoms include diaphoresis and fatigue. Pertinent negatives include no weakness.   [name] presents to the office today for follow-up of bipolar disorder type II and generalized anxiety disorder and history of panic attacks.  seen August 2020.  Her symptoms were in remission with the addition of Vraylar 1.5 mg to Pristiq.  She was continued on Vraylar and Pristiq and lamotrigine was tapered as it was felt to be no longer necessary given the benefit with Vraylar.  She was encouraged to contact us if she had any recurrence.  She wanted to try to reduce medications if possible.  seen October 02, 2019.  She reported:  Doing good.  No problems off the lamotrigine.  Still having dizzy spells off it.  Notice is more in the morning but can occur other times of days.  Probably not enough fluids.  No skipped doses of Pristiq.  Still low energy to do much and is a concern.  Normal thyroid check.  We decided on the following med change: Continue Vraylar 1.5 mg and Reduc Pristiq 25 mg daily to see if energy is better and see if it's necessary. After 4-6 weeks if ok stop it.  She called November 12, 2019 and left a message she would like to stop Vraylar because she felt like it was not helping.  She was encouraged to schedule an appointment.  As of November 28, 2019: Fine on 25 mg Pristiq but when stopped depression and anxiety and dizziness but less tired.  Restarted Pristiq and is tired again and focus is not quite as good.  Insurance on Pristiq is higher. Plan: Continue Vraylar 1.5 mg and Switch to fluoxetine 10 in hopes of less tiredness.  01/09/2020 appt, the following noted: No vaccine. Doing better with energy.  Anxiety is not  like I'd like since the switch and would like to increase fluoxetine.  Also started hormone that helped some too.  Patient reports stable mood and denies depressed or irritable moods.   Patient denies difficulty with sleep initiation or maintenance.  Wants to sleep a lot.  9 pm to 7:30 AM.  H says snore some.. Denies appetite disturbance.    Patient has some difficulty with concentration including at work.  Alertness OK daytime.  Patient denies any suicidal ideation. Last panic in a long while.   Caffeine reduced to 1 large cup AM. Alcohol 1 daily.  Past Psychiatric Medication Trials: Lamotrigine 200, Pristiq 100 mg, Vraylar 3 dizzy, Abilify  sertraline, fluoxetine, buspirone, Wellbutrin irritable,   Review of Systems:  Review of Systems  Constitutional: Positive for diaphoresis, fatigue and unexpected weight change.  Neurological: Negative for tremors and weakness.    Medications: I have reviewed the patient's current medications.  Current Outpatient Medications  Medication Sig Dispense Refill  . cariprazine (VRAYLAR) capsule Take 1 capsule (1.5 mg total) by mouth daily. 30 capsule 5  . Cetirizine HCl (ZYRTEC ALLERGY PO) Take 1 tablet by mouth daily as needed. For allergy.    . cholecalciferol (VITAMIN D3) 25 MCG (1000 UNIT) tablet Take 1,000 Units by mouth daily.    Marland Kitchen FLUoxetine (PROZAC) 10 MG capsule Take 1 capsule (10 mg total) by mouth daily. 90 capsule  0  . TURMERIC PO Take by mouth.    . vitamin B-12 (CYANOCOBALAMIN) 1000 MCG tablet Take 1,000 mcg by mouth daily.     No current facility-administered medications for this visit.    Medication Side Effects: None  Allergies:  Allergies  Allergen Reactions  . Penicillins Rash  . Wellbutrin [Bupropion] Other (See Comments)    "Irritable"    Past Medical History:  Diagnosis Date  . Anxiety   . Asthma   . CKD (chronic kidney disease), stage III   . Complication of anesthesia 2010   hard to wake up after colonoscopy, and  increased HR  . Depression   . Frequent falls   . GERD (gastroesophageal reflux disease)    no meds  . H/O hiatal hernia   . Kyphosis   . Melanoma ([place]) 2010   left hip  . Migraine headache     Family History  Problem Relation Age of Onset  . Pancreatic cancer Mother   . Hyperlipidemia Mother   . Stroke Mother   . Dementia Father   . Uterine cancer Maternal Grandmother   . Stroke Paternal Grandmother   . Diabetes Paternal Grandfather     Social History   Socioeconomic History  . Marital status: Married    Spouse name: Simona Huh  . Number of children: 2  . Years of education: Not on file  . Highest education level: Associate degree: academic program  Occupational History    Comment: Human relations  Tobacco Use  . Smoking status: Never Smoker  . Smokeless tobacco: Never Used  Substance and Sexual Activity  . Alcohol use: Yes    Comment: rare  . Drug use: No  . Sexual activity: Not on file  Other Topics Concern  . Not on file  Social History Narrative   Lives with spouse   Caffeine- 4 daily   Social Determinants of Health   Financial Resource Strain:   . Difficulty of Paying Living Expenses:   Food Insecurity:   . Worried About Charity fundraiser in the Last Year:   . Arboriculturist in the Last Year:   Transportation Needs:   . Film/video editor (Medical):   Marland Kitchen Lack of Transportation (Non-Medical):   Physical Activity:   . Days of Exercise per Week:   . Minutes of Exercise per Session:   Stress:   . Feeling of Stress :   Social Connections:   . Frequency of Communication with Friends and Family:   . Frequency of Social Gatherings with Friends and Family:   . Attends Religious Services:   . Active Member of Clubs or Organizations:   . Attends Archivist Meetings:   Marland Kitchen Marital Status:   Intimate Partner Violence:   . Fear of Current or Ex-Partner:   . Emotionally Abused:   Marland Kitchen Physically Abused:   . Sexually Abused:     Past Medical  History, Surgical history, Social history, and Family history were reviewed and updated as appropriate.   Please see review of systems for further details on the patient's review from today.   Objective:   Physical Exam:  There were no vitals taken for this visit.  Physical Exam Constitutional:      General: She is not in acute distress.    Appearance: She is well-developed. She is obese.  Musculoskeletal:        General: No deformity.  Neurological:     Mental Status: She is alert and oriented  to person, place, and time.     Coordination: Coordination normal.  Psychiatric:        Attention and Perception: Attention and perception normal. She does not perceive auditory or visual hallucinations.        Mood and Affect: Mood normal. Mood is not anxious or depressed. Affect is not labile, blunt, angry or inappropriate.        Speech: Speech normal.        Behavior: Behavior normal.        Thought Content: Thought content normal. Thought content is not paranoid or delusional. Thought content does not include homicidal or suicidal ideation. Thought content does not include homicidal or suicidal plan.        Cognition and Memory: Cognition and memory normal.        Judgment: Judgment normal.     Comments: Insight intact     Lab Review:  No results found for: NA, K, CL, CO2, GLUCOSE, BUN, CREATININE, CALCIUM, PROT, ALBUMIN, AST, ALT, ALKPHOS, BILITOT, GFRNONAA, GFRAA     Component Value Date/Time   WBC 6.7 02/18/2012 1130   RBC 4.31 02/18/2012 1130   HGB 13.0 02/18/2012 1130   HCT 37.5 02/18/2012 1130   PLT 199 02/18/2012 1130   MCV 87.0 02/18/2012 1130   MCH 30.2 02/18/2012 1130   MCHC 34.7 02/18/2012 1130   RDW 13.1 02/18/2012 1130    No results found for: POCLITH, LITHIUM   No results found for: PHENYTOIN, PHENOBARB, VALPROATE, CBMZ   .res Assessment: Plan:    Yaneri was seen today for anxiety and follow-up.  Diagnoses and all orders for this visit:  Bipolar II  disorder ([place])  Generalized anxiety disorder  Chronic fatigue      Greater than 50% of30 min face to face time with patient was spent on counseling and coordination of care. We discussed She admits a history of multiple depressive episodes.  She has been seen in our office since 2007.  There has been debate about whether she has an irritable depression or bipolar type II.  The decision was made to change her diagnosis to bipolar type II in part based on reactions to medication.  No change in diagnosis was made in 2006.  She had problems with irritability on Pristiq 100 mg daily.  Symptoms went into remission with the addition of Vraylar 1.5 mg at that time and the reduction in Pristiq.  Her symptoms have remained in good control since then.  It was discussed with her that because of the number of depressive episode she has had in her lifetime it is highly likely she will relapse without maintenance medication.  She has had good response with the Vraylar and we will expect to continue that medication.  She had an improvement in energy level off of Pristiq but a relapse of depression and anxiety as well as SSRI withdrawal.  The depression and anxiety resolved back on Pristiq but she had fatigue on it again.  We switched her off Pristiq in hopes of resolving the fatigue and that was successful with the switch to fluoxetine 10 mg daily.  She is not depressed and not having unusual irritability but she is having an increase in anxiety level.  She would like to increase the fluoxetine to better control anxiety.  We discussed the option of going up the least amount possible to 15 mg versus 20 mg which is more typical.  We decided to increase to 20 mg and if she had problems  with fatigue or if she started having mood cycling we would reduce the dose to 15 mg.  Continue Vraylar 1.5 mg and Increase  fluoxetine   To 20 mg to better control anxiety and if tiredness then call and we'll reduce to 15 mg daily.  Disc  SE in detail and SSRI withdrawal sx. call if depression or anxiety worsen.  Disc risk of mood cycling.  Discussed potential metabolic side effects associated with atypical antipsychotics, as well as potential risk for movement side effects. Advised pt to contact office if movement side effects occur.   FU 8 weeks  This appt was 30 mins.  Lynder Parents MD, DFAPA  Please see After Visit Summary for patient specific instructions.  Future Appointments  Date Time Provider [place]  04/02/2020  9:00 AM Cottle, Billey Co., MD [facility] None    No orders of the defined types were placed in this encounter.   -------------------------------

## 2020-02-06 ENCOUNTER — Other Ambulatory Visit: Payer: Self-pay

## 2020-02-06 DIAGNOSIS — F3181 Bipolar II disorder: Secondary | ICD-10-CM

## 2020-02-06 DIAGNOSIS — F411 Generalized anxiety disorder: Secondary | ICD-10-CM

## 2020-02-06 MED ORDER — FLUOXETINE HCL 20 MG PO CAPS: 20.0000 mg | ORAL_CAPSULE | Freq: Every day | ORAL | 5 refills | Status: DC

## 2020-02-07 ENCOUNTER — Other Ambulatory Visit: Payer: Self-pay | Admitting: Psychiatry

## 2020-02-07 DIAGNOSIS — F411 Generalized anxiety disorder: Secondary | ICD-10-CM

## 2020-02-07 DIAGNOSIS — F3181 Bipolar II disorder: Secondary | ICD-10-CM

## 2020-03-13 ENCOUNTER — Ambulatory Visit (INDEPENDENT_AMBULATORY_CARE_PROVIDER_SITE_OTHER): Payer: 59 | Admitting: Psychiatry

## 2020-03-13 ENCOUNTER — Encounter: Payer: Self-pay | Admitting: Psychiatry

## 2020-03-13 ENCOUNTER — Other Ambulatory Visit: Payer: Self-pay

## 2020-03-13 DIAGNOSIS — F411 Generalized anxiety disorder: Secondary | ICD-10-CM

## 2020-03-13 DIAGNOSIS — R5382 Chronic fatigue, unspecified: Secondary | ICD-10-CM | POA: Diagnosis not present

## 2020-03-13 DIAGNOSIS — F3181 Bipolar II disorder: Secondary | ICD-10-CM | POA: Diagnosis not present

## 2020-03-13 MED ORDER — FLUOXETINE HCL 20 MG PO CAPS: 20.0000 mg | ORAL_CAPSULE | Freq: Every day | ORAL | 1 refills | Status: DC

## 2020-03-13 NOTE — Progress Notes (Signed)
Dana [name] [id number] 1971-02-22 49 y.o.  Subjective:   Patient ID:  Dana [name] is a 49 y.o. (DOB July 21, 1971) female.  Chief Complaint:  Chief Complaint  Patient presents with  . Follow-up  . Anxiety  . Depression    Medication Refill Associated symptoms include fatigue. Pertinent negatives include no diaphoresis or weakness.   Dana [name] presents to the office today for follow-up of bipolar disorder type II and generalized anxiety disorder and history of panic attacks.  seen August 2020.  Her symptoms were in remission with the addition of Vraylar 1.5 mg to Pristiq.  She was continued on Vraylar and Pristiq and lamotrigine was tapered as it was felt to be no longer necessary given the benefit with Vraylar.  She was encouraged to contact us if she had any recurrence.  She wanted to try to reduce medications if possible.  seen October 02, 2019.  She reported:  Doing good.  No problems off the lamotrigine.  Still having dizzy spells off it.  Notice is more in the morning but can occur other times of days.  Probably not enough fluids.  No skipped doses of Pristiq.  Still low energy to do much and is a concern.  Normal thyroid check.  We decided on the following med change: Continue Vraylar 1.5 mg and Reduc Pristiq 25 mg daily to see if energy is better and see if it's necessary. After 4-6 weeks if ok stop it.  She called November 12, 2019 and left a message she would like to stop Vraylar because she felt like it was not helping.  She was encouraged to schedule an appointment.  As of November 28, 2019: Fine on 25 mg Pristiq but when stopped depression and anxiety and dizziness but less tired.  Restarted Pristiq and is tired again and focus is not quite as good.  Insurance on Pristiq is higher. Plan: Continue Vraylar 1.5 mg and Switch to fluoxetine 10 in hopes of less tiredness.  01/09/2020 appt, the following noted: No vaccine. Doing better with energy.  Anxiety is not  like I'd like since the switch and would like to increase fluoxetine.  Also started hormone that helped some too. Plan: Continue Vraylar 1.5 mg and Increase  fluoxetine   To 20 mg to better control anxiety and if tiredness then [name] and we'll reduce to 15 mg daily.  03/13/20 appt with following noted: A little less anxious but wants to increase fluoxetine further.  Tolerated it.  Still worry too much and worse than it should be.  Dep and mood swings controlled.  Sleep good. Energy no change. Sweating better with hormone.  Patient reports stable mood and denies depressed or irritable moods.   Patient denies difficulty with sleep initiation or maintenance.  Wants to sleep a lot.  9 pm to 7:30 AM.  H says snore some.. Denies appetite disturbance.    Patient has some difficulty with concentration including at work.  Alertness OK daytime.  Patient denies any suicidal ideation. Last panic in a long while.   Caffeine reduced to 1 large cup AM. Alcohol 1 daily.  Past Psychiatric Medication Trials: Lamotrigine 200, Pristiq 100 mg, Vraylar 3 dizzy, Abilify  sertraline, fluoxetine, buspirone, Wellbutrin irritable,   Review of Systems:  Review of Systems  Constitutional: Positive for fatigue and unexpected weight change. Negative for diaphoresis.  Neurological: Negative for tremors and weakness.    Medications: I have reviewed the patient's current medications.  Current Outpatient Medications  Medication Sig Dispense Refill  . cariprazine (VRAYLAR) capsule Take 1 capsule (1.5 mg total) by mouth daily. 30 capsule 5  . Cetirizine HCl (ZYRTEC ALLERGY PO) Take 1 tablet by mouth daily as needed. For allergy.    . cholecalciferol (VITAMIN D3) 25 MCG (1000 UNIT) tablet Take 1,000 Units by mouth daily.    Marland Kitchen FLUoxetine (PROZAC) 20 MG capsule Take 1 capsule (20 mg total) by mouth daily. 90 capsule 1  . TURMERIC PO Take by mouth.    . vitamin B-12 (CYANOCOBALAMIN) 1000 MCG tablet Take 1,000 mcg by mouth daily.      No current facility-administered medications for this visit.    Medication Side Effects: None  Allergies:  Allergies  Allergen Reactions  . Penicillins Rash  . Wellbutrin [Bupropion] Other (See Comments)    "Irritable"    Past Medical History:  Diagnosis Date  . Anxiety   . Asthma   . CKD (chronic kidney disease), stage III   . Complication of anesthesia 2010   hard to wake up after colonoscopy, and increased HR  . Depression   . Frequent falls   . GERD (gastroesophageal reflux disease)    no meds  . H/O hiatal hernia   . Kyphosis   . Melanoma ([place]) 2010   left hip  . Migraine headache     Family History  Problem Relation Age of Onset  . Pancreatic cancer Mother   . Hyperlipidemia Mother   . Stroke Mother   . Dementia Father   . Uterine cancer Maternal Grandmother   . Stroke Paternal Grandmother   . Diabetes Paternal Grandfather     Social History   Socioeconomic History  . Marital status: Married    Spouse name: Simona Huh  . Number of children: 2  . Years of education: Not on file  . Highest education level: Associate degree: academic program  Occupational History    Comment: Human relations  Tobacco Use  . Smoking status: Never Smoker  . Smokeless tobacco: Never Used  Vaping Use  . Vaping Use: Never used  Substance and Sexual Activity  . Alcohol use: Yes    Comment: rare  . Drug use: No  . Sexual activity: Not on file  Other Topics Concern  . Not on file  Social History Narrative   Lives with spouse   Caffeine- 4 daily   Social Determinants of Health   Financial Resource Strain:   . Difficulty of Paying Living Expenses:   Food Insecurity:   . Worried About Charity fundraiser in the Last Year:   . Arboriculturist in the Last Year:   Transportation Needs:   . Film/video editor (Medical):   Marland Kitchen Lack of Transportation (Non-Medical):   Physical Activity:   . Days of Exercise per Week:   . Minutes of Exercise per Session:   Stress:    . Feeling of Stress :   Social Connections:   . Frequency of Communication with Friends and Family:   . Frequency of Social Gatherings with Friends and Family:   . Attends Religious Services:   . Active Member of Clubs or Organizations:   . Attends Archivist Meetings:   Marland Kitchen Marital Status:   Intimate Partner Violence:   . Fear of Current or Ex-Partner:   . Emotionally Abused:   Marland Kitchen Physically Abused:   . Sexually Abused:     Past Medical History, Surgical history, Social history, and Family history were reviewed and updated  as appropriate.   Please see review of systems for further details on the patient's review from today.   Objective:   Physical Exam:  There were no vitals taken for this visit.  Physical Exam Constitutional:      General: She is not in acute distress.    Appearance: She is well-developed. She is obese.  Musculoskeletal:        General: No deformity.  Neurological:     Mental Status: She is alert and oriented to person, place, and time.     Coordination: Coordination normal.  Psychiatric:        Attention and Perception: Attention and perception normal. She does not perceive auditory or visual hallucinations.        Mood and Affect: Mood is anxious. Mood is not depressed. Affect is not labile, blunt, angry or inappropriate.        Speech: Speech normal.        Behavior: Behavior normal.        Thought Content: Thought content normal. Thought content is not paranoid or delusional. Thought content does not include homicidal or suicidal ideation. Thought content does not include homicidal or suicidal plan.        Cognition and Memory: Cognition and memory normal.        Judgment: Judgment normal.     Comments: Insight intact     Lab Review:  No results found for: NA, K, CL, CO2, GLUCOSE, BUN, CREATININE, CALCIUM, PROT, ALBUMIN, AST, ALT, ALKPHOS, BILITOT, GFRNONAA, GFRAA     Component Value Date/Time   WBC 6.7 02/18/2012 1130   RBC 4.31  02/18/2012 1130   HGB 13.0 02/18/2012 1130   HCT 37.5 02/18/2012 1130   PLT 199 02/18/2012 1130   MCV 87.0 02/18/2012 1130   MCH 30.2 02/18/2012 1130   MCHC 34.7 02/18/2012 1130   RDW 13.1 02/18/2012 1130    No results found for: POCLITH, LITHIUM   No results found for: PHENYTOIN, PHENOBARB, VALPROATE, CBMZ   .res Assessment: Plan:    Louan was seen today for follow-up, anxiety and depression.  Diagnoses and all orders for this visit:  Bipolar II disorder ([place]) -     FLUoxetine (PROZAC) 20 MG capsule; Take 1 capsule (20 mg total) by mouth daily.  Generalized anxiety disorder -     FLUoxetine (PROZAC) 20 MG capsule; Take 1 capsule (20 mg total) by mouth daily.  Chronic fatigue      Greater than 50% of30 min face to face time with patient was spent on counseling and coordination of care. We discussed She admits a history of multiple depressive episodes.  She has been seen in our office since 2007.  There has been debate about whether she has an irritable depression or bipolar type II.  The decision was made to change her diagnosis to bipolar type II in part based on reactions to medication.  No change in diagnosis was made in 2006.  She had problems with irritability on Pristiq 100 mg daily.  Symptoms went into remission with the addition of Vraylar 1.5 mg at that time and the reduction in Pristiq.  Her symptoms have remained in good control since then.  It was discussed with her that because of the number of depressive episode she has had in her lifetime it is highly likely she will relapse without maintenance medication.  She has had good response with the Vraylar and we will expect to continue that medication.  She had an improvement in  energy level off of Pristiq but a relapse of depression and anxiety as well as SSRI withdrawal.  The depression and anxiety resolved back on Pristiq but she had fatigue on it again.  We switched her off Pristiq in hopes of resolving the fatigue and  that was successful with the switch to fluoxetine   Continue Vraylar 1.5 mg and Increase  fluoxetine   To 30 mg to better control anxiety.  Disc SE in detail and SSRI withdrawal sx. [name] if depression or anxiety worsen.  Disc risk of mood cycling.  Discussed potential metabolic side effects associated with atypical antipsychotics, as well as potential risk for movement side effects. Advised pt to contact office if movement side effects occur.   FU 8 weeks  This appt was 30 mins.  Lynder Parents MD, DFAPA  Please see After Visit Summary for patient specific instructions.  No future appointments.  No orders of the defined types were placed in this encounter.   -------------------------------

## 2020-03-14 ENCOUNTER — Ambulatory Visit: Payer: 59 | Attending: Critical Care Medicine

## 2020-03-14 DIAGNOSIS — Z23 Encounter for immunization: Secondary | ICD-10-CM

## 2020-03-14 NOTE — Progress Notes (Signed)
   Covid-19 Vaccination Clinic  Name:  Dana [name]    MRN: [id number] DOB: 03/20/1971  03/14/2020  [name] was observed post Covid-19 immunization for 15 minutes without incident. She was provided with Vaccine Information Sheet and instruction to access the V-Safe system.   [name] was instructed to call 911 with any severe reactions post vaccine: Marland Kitchen Difficulty breathing  . Swelling of face and throat  . A fast heartbeat  . A bad rash all over body  . Dizziness and weakness   Immunizations Administered    Name Date Dose VIS Date Route   Moderna COVID-19 Vaccine 03/14/2020  1:12 PM 0.5 mL 07/2019 Intramuscular   Manufacturer: Moderna   Lot: [id number]   [place]: 91028-902-28

## 2020-03-19 ENCOUNTER — Other Ambulatory Visit: Payer: Self-pay

## 2020-03-19 ENCOUNTER — Telehealth: Payer: Self-pay | Admitting: Psychiatry

## 2020-03-19 MED ORDER — FLUOXETINE HCL 10 MG PO CAPS
ORAL_CAPSULE | ORAL | 0 refills | Status: DC
Start: 2020-03-19 — End: 2020-05-20

## 2020-03-19 NOTE — Telephone Encounter (Signed)
Dana [name] called and said the pharmacy did not receive her prescription for 10mg  fluoxetine. You were going to increase her dose to 30 mg and needed 10mg  to go with the 20mg  for a total of 30mg .  Again there is not a prescription for 10mg .  Please send to [facility] on [address] in [place].

## 2020-03-19 NOTE — Telephone Encounter (Signed)
Rx sent. Per last office visit patient was to increase Prozac to 30 mg daily.

## 2020-04-02 ENCOUNTER — Ambulatory Visit: Payer: 59 | Admitting: Psychiatry

## 2020-04-11 ENCOUNTER — Ambulatory Visit: Payer: 59

## 2020-04-15 ENCOUNTER — Ambulatory Visit: Payer: 59

## 2020-04-15 ENCOUNTER — Other Ambulatory Visit: Payer: Self-pay | Admitting: Psychiatry

## 2020-04-15 DIAGNOSIS — F3181 Bipolar II disorder: Secondary | ICD-10-CM

## 2020-05-20 ENCOUNTER — Other Ambulatory Visit: Payer: Self-pay

## 2020-05-20 ENCOUNTER — Encounter: Payer: Self-pay | Admitting: Psychiatry

## 2020-05-20 ENCOUNTER — Ambulatory Visit (INDEPENDENT_AMBULATORY_CARE_PROVIDER_SITE_OTHER): Payer: 59 | Admitting: Psychiatry

## 2020-05-20 DIAGNOSIS — F411 Generalized anxiety disorder: Secondary | ICD-10-CM

## 2020-05-20 DIAGNOSIS — F3181 Bipolar II disorder: Secondary | ICD-10-CM | POA: Diagnosis not present

## 2020-05-20 DIAGNOSIS — R5382 Chronic fatigue, unspecified: Secondary | ICD-10-CM

## 2020-05-20 MED ORDER — MODAFINIL 200 MG PO TABS: 200.0000 mg | ORAL_TABLET | Freq: Every day | ORAL | 1 refills | Status: DC

## 2020-05-20 MED ORDER — FLUOXETINE HCL 10 MG PO CAPS: ORAL_CAPSULE | ORAL | 1 refills | Status: DC

## 2020-05-20 NOTE — Progress Notes (Signed)
BRITAINY [name] [id number] June 08, 1971 49 y.o.  Subjective:   Patient ID:  Dana [name] is a 49 y.o. (DOB 02/28/1971) female.  Chief Complaint:  Chief Complaint  Patient presents with  . Follow-up    mood and med changes  . Anxiety    Medication Refill Associated symptoms include fatigue. Pertinent negatives include no diaphoresis or weakness.   [name] presents to the office today for follow-up of bipolar disorder type II and generalized anxiety disorder and history of panic attacks.  seen August 2020.  Her symptoms were in remission with the addition of Vraylar 1.5 mg to Pristiq.  She was continued on Vraylar and Pristiq and lamotrigine was tapered as it was felt to be no longer necessary given the benefit with Vraylar.  She was encouraged to contact us if she had any recurrence.  She wanted to try to reduce medications if possible.  seen October 02, 2019.  She reported:  Doing good.  No problems off the lamotrigine.  Still having dizzy spells off it.  Notice is more in the morning but can occur other times of days.  Probably not enough fluids.  No skipped doses of Pristiq.  Still low energy to do much and is a concern.  Normal thyroid check.  We decided on the following med change: Continue Vraylar 1.5 mg and Reduc Pristiq 25 mg daily to see if energy is better and see if it's necessary. After 4-6 weeks if ok stop it.  She called November 12, 2019 and left a message she would like to stop Vraylar because she felt like it was not helping.  She was encouraged to schedule an appointment.  As of November 28, 2019: Fine on 25 mg Pristiq but when stopped depression and anxiety and dizziness but less tired.  Restarted Pristiq and is tired again and focus is not quite as good.  Insurance on Pristiq is higher. Plan: Continue Vraylar 1.5 mg and Switch to fluoxetine 10 in hopes of less tiredness.  01/09/2020 appt, the following noted: No vaccine. Doing better with energy.   Anxiety is not like I'd like since the switch and would like to increase fluoxetine.  Also started hormone that helped some too. Plan: Continue Vraylar 1.5 mg and Increase  fluoxetine   To 20 mg to better control anxiety and if tiredness then call and we'll reduce to 15 mg daily.  03/13/20 appt with following noted: A little less anxious but wants to increase fluoxetine further.  Tolerated it.  Still worry too much and worse than it should be.  Dep and mood swings controlled.  Sleep good. Energy no change. Sweating better with hormone. Plan: Continue Vraylar 1.5 mg and Increase  fluoxetine   To 30 mg to better control anxiety.  05/20/20 appt with following noted: Better but still not right.  Trouble focusing and being productive.  Not depressed. But low energy and motivation.  Anxiety is under control. Hormone has helped but not enough of something.  No falls or dizziness.  Patient reports stable mood and denies depressed or irritable moods.   Patient denies difficulty with sleep initiation or maintenance.  Still Wants to sleep a lot.  9 pm to 7:30 AM.  H says snore some.. Denies appetite disturbance.    Patient has some difficulty with concentration including at work.  Alertness OK daytime.  Patient denies any suicidal ideation. Last panic in a long while.   Caffeine reduced to 1 large cup AM. Alcohol  1 daily.  Past Psychiatric Medication Trials: Lamotrigine 200, Vraylar 3 dizzy, Abilify  Pristiq 100 mg, sertraline, fluoxetine 30 , buspirone, Wellbutrin irritable,   Review of Systems:  Review of Systems  Constitutional: Positive for fatigue. Negative for diaphoresis and unexpected weight change.  Neurological: Negative for dizziness, tremors and weakness.    Medications: I have reviewed the patient's current medications.  Current Outpatient Medications  Medication Sig Dispense Refill  . Cetirizine HCl (ZYRTEC ALLERGY PO) Take 1 tablet by mouth daily as needed. For allergy.    .  cholecalciferol (VITAMIN D3) 25 MCG (1000 UNIT) tablet Take 1,000 Units by mouth daily.    . TURMERIC PO Take by mouth.    . vitamin B-12 (CYANOCOBALAMIN) 1000 MCG tablet Take 1,000 mcg by mouth daily.    Marland Kitchen VRAYLAR capsule Take 1 capsule by mouth once daily 30 capsule 2  . FLUoxetine (PROZAC) 10 MG capsule Take 1 capsule (10 mg) by mouth daily with 20 mg to equal 30 mg total 270 capsule 1  . modafinil (PROVIGIL) 200 MG tablet Take 1 tablet (200 mg total) by mouth daily. 30 tablet 1   No current facility-administered medications for this visit.    Medication Side Effects: None  Allergies:  Allergies  Allergen Reactions  . Penicillins Rash  . Wellbutrin [Bupropion] Other (See Comments)    "Irritable"    Past Medical History:  Diagnosis Date  . Anxiety   . Asthma   . CKD (chronic kidney disease), stage III   . Complication of anesthesia 2010   hard to wake up after colonoscopy, and increased HR  . Depression   . Frequent falls   . GERD (gastroesophageal reflux disease)    no meds  . H/O hiatal hernia   . Kyphosis   . Melanoma ([place]) 2010   left hip  . Migraine headache     Family History  Problem Relation Age of Onset  . Pancreatic cancer Mother   . Hyperlipidemia Mother   . Stroke Mother   . Dementia Father   . Uterine cancer Maternal Grandmother   . Stroke Paternal Grandmother   . Diabetes Paternal Grandfather     Social History   Socioeconomic History  . Marital status: Married    Spouse name: Simona Huh  . Number of children: 2  . Years of education: Not on file  . Highest education level: Associate degree: academic program  Occupational History    Comment: Human relations  Tobacco Use  . Smoking status: Never Smoker  . Smokeless tobacco: Never Used  Vaping Use  . Vaping Use: Never used  Substance and Sexual Activity  . Alcohol use: Yes    Comment: rare  . Drug use: No  . Sexual activity: Not on file  Other Topics Concern  . Not on file  Social  History Narrative   Lives with spouse   Caffeine- 4 daily   Social Determinants of Health   Financial Resource Strain:   . Difficulty of Paying Living Expenses: Not on file  Food Insecurity:   . Worried About Charity fundraiser in the Last Year: Not on file  . Ran Out of Food in the Last Year: Not on file  Transportation Needs:   . Lack of Transportation (Medical): Not on file  . Lack of Transportation (Non-Medical): Not on file  Physical Activity:   . Days of Exercise per Week: Not on file  . Minutes of Exercise per Session: Not on file  Stress:   .  Feeling of Stress : Not on file  Social Connections:   . Frequency of Communication with Friends and Family: Not on file  . Frequency of Social Gatherings with Friends and Family: Not on file  . Attends Religious Services: Not on file  . Active Member of Clubs or Organizations: Not on file  . Attends Archivist Meetings: Not on file  . Marital Status: Not on file  Intimate Partner Violence:   . Fear of Current or Ex-Partner: Not on file  . Emotionally Abused: Not on file  . Physically Abused: Not on file  . Sexually Abused: Not on file    Past Medical History, Surgical history, Social history, and Family history were reviewed and updated as appropriate.   Please see review of systems for further details on the patient's review from today.   Objective:   Physical Exam:  There were no vitals taken for this visit.  Physical Exam Constitutional:      General: She is not in acute distress.    Appearance: She is well-developed. She is obese.  Musculoskeletal:        General: No deformity.  Neurological:     Mental Status: She is alert and oriented to person, place, and time.     Coordination: Coordination normal.  Psychiatric:        Attention and Perception: Attention and perception normal. She does not perceive auditory or visual hallucinations.        Mood and Affect: Mood is not anxious or depressed. Affect  is not labile, blunt, angry or inappropriate.        Speech: Speech normal.        Behavior: Behavior normal.        Thought Content: Thought content normal. Thought content is not paranoid or delusional. Thought content does not include homicidal or suicidal ideation. Thought content does not include homicidal or suicidal plan.        Cognition and Memory: Cognition and memory normal.        Judgment: Judgment normal.     Comments: Insight intact     Lab Review:  No results found for: NA, K, CL, CO2, GLUCOSE, BUN, CREATININE, CALCIUM, PROT, ALBUMIN, AST, ALT, ALKPHOS, BILITOT, GFRNONAA, GFRAA     Component Value Date/Time   WBC 6.7 02/18/2012 1130   RBC 4.31 02/18/2012 1130   HGB 13.0 02/18/2012 1130   HCT 37.5 02/18/2012 1130   PLT 199 02/18/2012 1130   MCV 87.0 02/18/2012 1130   MCH 30.2 02/18/2012 1130   MCHC 34.7 02/18/2012 1130   RDW 13.1 02/18/2012 1130    No results found for: POCLITH, LITHIUM   No results found for: PHENYTOIN, PHENOBARB, VALPROATE, CBMZ   .res Assessment: Plan:    [place] was seen today for follow-up and anxiety.  Diagnoses and all orders for this visit:  Bipolar II disorder ([place]) -     modafinil (PROVIGIL) 200 MG tablet; Take 1 tablet (200 mg total) by mouth daily.  Generalized anxiety disorder -     FLUoxetine (PROZAC) 10 MG capsule; Take 1 capsule (10 mg) by mouth daily with 20 mg to equal 30 mg total  Chronic fatigue -     modafinil (PROVIGIL) 200 MG tablet; Take 1 tablet (200 mg total) by mouth daily.      Greater than 50% of30 min face to face time with patient was spent on counseling and coordination of care. We discussed She admits a history of multiple depressive episodes.  She has been seen in our office since 2007.  There has been debate about whether she has an irritable depression or bipolar type II.  The decision was made to change her diagnosis to bipolar type II in part based on reactions to medication.  No change in diagnosis  was made in 2006.  She had problems with irritability on Pristiq 100 mg daily.  Symptoms went into remission with the addition of Vraylar 1.5 mg at that time and the reduction in Pristiq.  Her symptoms have remained in good control since then.  It was discussed with her that because of the number of depressive episode she has had in her lifetime it is highly likely she will relapse without maintenance medication.  She has had good response with the Vraylar and we will expect to continue that medication.  Continue Vraylar 1.5 mg and The Increase  fluoxetine   To 30 mg to better control anxiety was successful and tolerated.  Disc SE in detail and SSRI withdrawal sx. call if depression or anxiety worsen.  Disc risk of mood cycling. Modafinil off label 100-200 mg AM  Discussed potential metabolic side effects associated with atypical antipsychotics, as well as potential risk for movement side effects. Advised pt to contact office if movement side effects occur.   FU 8 weeks  This appt was 30 mins.  Lynder Parents MD, DFAPA  Please see After Visit Summary for patient specific instructions.  No future appointments.  No orders of the defined types were placed in this encounter.   -------------------------------

## 2020-07-22 ENCOUNTER — Ambulatory Visit (INDEPENDENT_AMBULATORY_CARE_PROVIDER_SITE_OTHER): Payer: 59 | Admitting: Psychiatry

## 2020-07-22 ENCOUNTER — Encounter: Payer: Self-pay | Admitting: Psychiatry

## 2020-07-22 ENCOUNTER — Other Ambulatory Visit: Payer: Self-pay

## 2020-07-22 DIAGNOSIS — F411 Generalized anxiety disorder: Secondary | ICD-10-CM

## 2020-07-22 DIAGNOSIS — F3181 Bipolar II disorder: Secondary | ICD-10-CM | POA: Diagnosis not present

## 2020-07-22 DIAGNOSIS — R5382 Chronic fatigue, unspecified: Secondary | ICD-10-CM

## 2020-07-22 MED ORDER — MODAFINIL 200 MG PO TABS: 200.0000 mg | ORAL_TABLET | Freq: Every day | ORAL | 1 refills | Status: DC

## 2020-07-22 MED ORDER — CARIPRAZINE HCL 1.5 MG PO CAPS: 1.5000 mg | ORAL_CAPSULE | Freq: Every day | ORAL | 3 refills | Status: DC

## 2020-07-22 NOTE — Progress Notes (Signed)
Dana [name] [id number] Nov 21, 1970 49 y.o.  Subjective:   Patient ID:  Dana [name] is a 49 y.o. (DOB May 01, 1971) female.  Chief Complaint:  Chief Complaint  Patient presents with  . Follow-up  . Depression  . Fatigue    Medication Refill Associated symptoms include fatigue. Pertinent negatives include no diaphoresis or weakness.   Dana [name] presents to the office today for follow-up of bipolar disorder type II and generalized anxiety disorder and history of panic attacks.  seen August 2020.  Her symptoms were in remission with the addition of Vraylar 1.5 mg to Pristiq.  She was continued on Vraylar and Pristiq and lamotrigine was tapered as it was felt to be no longer necessary given the benefit with Vraylar.  She was encouraged to contact us if she had any recurrence.  She wanted to try to reduce medications if possible.  seen October 02, 2019.  She reported:  Doing good.  No problems off the lamotrigine.  Still having dizzy spells off it.  Notice is more in the morning but can occur other times of days.  Probably not enough fluids.  No skipped doses of Pristiq.  Still low energy to do much and is a concern.  Normal thyroid check.  We decided on the following med change: Continue Vraylar 1.5 mg and Reduc Pristiq 25 mg daily to see if energy is better and see if it's necessary. After 4-6 weeks if ok stop it.  She called November 12, 2019 and left a message she would like to stop Vraylar because she felt like it was not helping.  She was encouraged to schedule an appointment.  As of November 28, 2019: Fine on 25 mg Pristiq but when stopped depression and anxiety and dizziness but less tired.  Restarted Pristiq and is tired again and focus is not quite as good.  Insurance on Pristiq is higher. Plan: Continue Vraylar 1.5 mg and Switch to fluoxetine 10 in hopes of less tiredness.  01/09/2020 appt, the following noted: No vaccine. Doing better with energy.  Anxiety is not  like I'd like since the switch and would like to increase fluoxetine.  Also started hormone that helped some too. Plan: Continue Vraylar 1.5 mg and Increase  fluoxetine   To 20 mg to better control anxiety and if tiredness then [name] and we'll reduce to 15 mg daily.  03/13/20 appt with following noted: A little less anxious but wants to increase fluoxetine further.  Tolerated it.  Still worry too much and worse than it should be.  Dep and mood swings controlled.  Sleep good. Energy no change. Sweating better with hormone. Plan: Continue Vraylar 1.5 mg and Increase  fluoxetine   To 30 mg to better control anxiety.  05/20/20 appt with following noted: Better but still not right.  Trouble focusing and being productive.  Not depressed. But low energy and motivation.  Anxiety is under control. Hormone has helped but not enough of something.  No falls or dizziness. Plan: Continue Vraylar 1.5 mg and The Increase  fluoxetine   To 30 mg to better control anxiety was successful and tolerated. Modafinil off label 100-200 mg AM  07/22/2020 appointment with the following noted: Added modafinil and not nearly as tired and better focus on 100 mg daily.  Never tried 200 mg daily.  Still fatigued. A little seasonal depression is typical but OK.  Starting to fgeel pretty normal.  Anxiety managed.  Patient reports stable mood and denies depressed  or irritable moods.   Patient denies difficulty with sleep initiation or maintenance. Not needing excess sleep with modafinil.Marland Kitchen  H says snore some.. Denies appetite disturbance.    Better concentration including at work.  Alertness OK daytime.  Patient denies any suicidal ideation. Last panic in a long while.   Caffeine reduced to 1 large cup AM. Alcohol 1 daily.  Past Psychiatric Medication Trials: Lamotrigine 200, Vraylar 3 dizzy, Abilify  Pristiq 100 mg, sertraline, fluoxetine 30 , buspirone, Wellbutrin irritable,  Modafinil 100  Review of Systems:  Review of  Systems  Constitutional: Positive for fatigue. Negative for diaphoresis and unexpected weight change.  Cardiovascular: Negative for palpitations.  Neurological: Negative for dizziness, tremors and weakness.    Medications: I have reviewed the patient's current medications.  Current Outpatient Medications  Medication Sig Dispense Refill  . Cetirizine HCl (ZYRTEC ALLERGY PO) Take 1 tablet by mouth daily as needed. For allergy.    . cholecalciferol (VITAMIN D3) 25 MCG (1000 UNIT) tablet Take 1,000 Units by mouth daily.    Marland Kitchen FLUoxetine (PROZAC) 10 MG capsule Take 1 capsule (10 mg) by mouth daily with 20 mg to equal 30 mg total 270 capsule 1  . TURMERIC PO Take by mouth.    . vitamin B-12 (CYANOCOBALAMIN) 1000 MCG tablet Take 1,000 mcg by mouth daily.    . cariprazine (VRAYLAR) capsule Take 1 capsule (1.5 mg total) by mouth daily. 30 capsule 3  . modafinil (PROVIGIL) 200 MG tablet Take 1 tablet (200 mg total) by mouth daily. 30 tablet 1   No current facility-administered medications for this visit.    Medication Side Effects: None  Allergies:  Allergies  Allergen Reactions  . Penicillins Rash  . Wellbutrin [Bupropion] Other (See Comments)    "Irritable"    Past Medical History:  Diagnosis Date  . Anxiety   . Asthma   . CKD (chronic kidney disease), stage III ([place])   . Complication of anesthesia 2010   hard to wake up after colonoscopy, and increased HR  . Depression   . Frequent falls   . GERD (gastroesophageal reflux disease)    no meds  . H/O hiatal hernia   . Kyphosis   . Melanoma ([place]) 2010   left hip  . Migraine headache     Family History  Problem Relation Age of Onset  . Pancreatic cancer Mother   . Hyperlipidemia Mother   . Stroke Mother   . Dementia Father   . Uterine cancer Maternal Grandmother   . Stroke Paternal Grandmother   . Diabetes Paternal Grandfather     Social History   Socioeconomic History  . Marital status: Married    Spouse name:  Simona Huh  . Number of children: 2  . Years of education: Not on file  . Highest education level: Associate degree: academic program  Occupational History    Comment: Human relations  Tobacco Use  . Smoking status: Never Smoker  . Smokeless tobacco: Never Used  Vaping Use  . Vaping Use: Never used  Substance and Sexual Activity  . Alcohol use: Yes    Comment: rare  . Drug use: No  . Sexual activity: Not on file  Other Topics Concern  . Not on file  Social History Narrative   Lives with spouse   Caffeine- 4 daily   Social Determinants of Health   Financial Resource Strain:   . Difficulty of Paying Living Expenses: Not on file  Food Insecurity:   . Worried About  Running Out of Food in the Last Year: Not on file  . Ran Out of Food in the Last Year: Not on file  Transportation Needs:   . Lack of Transportation (Medical): Not on file  . Lack of Transportation (Non-Medical): Not on file  Physical Activity:   . Days of Exercise per Week: Not on file  . Minutes of Exercise per Session: Not on file  Stress:   . Feeling of Stress : Not on file  Social Connections:   . Frequency of Communication with Friends and Family: Not on file  . Frequency of Social Gatherings with Friends and Family: Not on file  . Attends Religious Services: Not on file  . Active Member of Clubs or Organizations: Not on file  . Attends Archivist Meetings: Not on file  . Marital Status: Not on file  Intimate Partner Violence:   . Fear of Current or Ex-Partner: Not on file  . Emotionally Abused: Not on file  . Physically Abused: Not on file  . Sexually Abused: Not on file    Past Medical History, Surgical history, Social history, and Family history were reviewed and updated as appropriate.   Please see review of systems for further details on the patient's review from today.   Objective:   Physical Exam:  There were no vitals taken for this visit.  Physical Exam Constitutional:       General: She is not in acute distress.    Appearance: She is well-developed. She is obese.  Musculoskeletal:        General: No deformity.  Neurological:     Mental Status: She is alert and oriented to person, place, and time.     Coordination: Coordination normal.  Psychiatric:        Attention and Perception: Attention and perception normal. She does not perceive auditory or visual hallucinations.        Mood and Affect: Mood is depressed. Mood is not anxious. Affect is not labile, blunt, angry or inappropriate.        Speech: Speech normal.        Behavior: Behavior normal.        Thought Content: Thought content normal. Thought content is not paranoid or delusional. Thought content does not include homicidal or suicidal ideation. Thought content does not include homicidal or suicidal plan.        Cognition and Memory: Cognition and memory normal.        Judgment: Judgment normal.     Comments: Insight intact. Mild seasonal depression     Lab Review:  No results found for: NA, K, CL, CO2, GLUCOSE, BUN, CREATININE, CALCIUM, PROT, ALBUMIN, AST, ALT, ALKPHOS, BILITOT, GFRNONAA, GFRAA     Component Value Date/Time   WBC 6.7 02/18/2012 1130   RBC 4.31 02/18/2012 1130   HGB 13.0 02/18/2012 1130   HCT 37.5 02/18/2012 1130   PLT 199 02/18/2012 1130   MCV 87.0 02/18/2012 1130   MCH 30.2 02/18/2012 1130   MCHC 34.7 02/18/2012 1130   RDW 13.1 02/18/2012 1130    No results found for: POCLITH, LITHIUM   No results found for: PHENYTOIN, PHENOBARB, VALPROATE, CBMZ   .res Assessment: Plan:    [place] was seen today for follow-up, depression and fatigue.  Diagnoses and all orders for this visit:  Bipolar II disorder ([place]) -     modafinil (PROVIGIL) 200 MG tablet; Take 1 tablet (200 mg total) by mouth daily. -     cariprazine (  VRAYLAR) capsule; Take 1 capsule (1.5 mg total) by mouth daily.  Generalized anxiety disorder  Chronic fatigue -     modafinil (PROVIGIL) 200 MG tablet;  Take 1 tablet (200 mg total) by mouth daily.      Greater than 50% of30 min face to face time with patient was spent on counseling and coordination of care. We discussed She admits a history of multiple depressive episodes.  She has been seen in our office since 2007.  There has been debate about whether she has an irritable depression or bipolar type II.  The decision was made to change her diagnosis to bipolar type II in part based on reactions to medication.  No change in diagnosis was made in 2006.  She had problems with irritability on Pristiq 100 mg daily.  Symptoms went into remission with the addition of Vraylar 1.5 mg at that time and the reduction in Pristiq.  Her symptoms have remained in good control since then.  It was discussed with her that because of the number of depressive episode she has had in her lifetime it is highly likely she will relapse without maintenance medication.  She has had good response with the Vraylar and we will expect to continue that medication.  Continue Vraylar 1.5 mg and The Increase  fluoxetine   To 30 mg to better control anxiety was successful and tolerated.  Disc SE in detail and SSRI withdrawal sx. [name] if depression or anxiety worsen.  Disc risk of mood cycling. Modafinil off label 100-200 mg AM.  It has been helpful  Discussed potential metabolic side effects associated with atypical antipsychotics, as well as potential risk for movement side effects. Advised pt to contact office if movement side effects occur.   FU 3-4 mos  This appt was 30 mins.  Lynder Parents MD, DFAPA  Please see After Visit Summary for patient specific instructions.  No future appointments.  No orders of the defined types were placed in this encounter.   -------------------------------

## 2020-07-25 ENCOUNTER — Telehealth: Payer: Self-pay

## 2020-07-25 NOTE — Telephone Encounter (Signed)
Prior authorization submitted and approved for MODAFINIL 200 MG effective 07/25/2020-07/25/2021 with [employer], PA# [id number]

## 2020-10-01 ENCOUNTER — Other Ambulatory Visit: Payer: Self-pay | Admitting: Psychiatry

## 2020-10-01 DIAGNOSIS — F3181 Bipolar II disorder: Secondary | ICD-10-CM

## 2020-10-01 DIAGNOSIS — F411 Generalized anxiety disorder: Secondary | ICD-10-CM

## 2020-10-28 ENCOUNTER — Other Ambulatory Visit: Payer: Self-pay

## 2020-10-28 ENCOUNTER — Encounter: Payer: Self-pay | Admitting: Psychiatry

## 2020-10-28 ENCOUNTER — Ambulatory Visit (INDEPENDENT_AMBULATORY_CARE_PROVIDER_SITE_OTHER): Payer: 59 | Admitting: Psychiatry

## 2020-10-28 DIAGNOSIS — R5382 Chronic fatigue, unspecified: Secondary | ICD-10-CM | POA: Diagnosis not present

## 2020-10-28 DIAGNOSIS — F411 Generalized anxiety disorder: Secondary | ICD-10-CM

## 2020-10-28 DIAGNOSIS — F3181 Bipolar II disorder: Secondary | ICD-10-CM | POA: Diagnosis not present

## 2020-10-28 MED ORDER — MODAFINIL 200 MG PO TABS: 200.0000 mg | ORAL_TABLET | Freq: Every day | ORAL | 5 refills | Status: DC

## 2020-10-28 MED ORDER — FLUOXETINE HCL 10 MG PO CAPS: ORAL_CAPSULE | ORAL | 1 refills | Status: DC

## 2020-10-28 MED ORDER — FLUOXETINE HCL 20 MG PO CAPS: 20.0000 mg | ORAL_CAPSULE | Freq: Every day | ORAL | 1 refills | Status: DC

## 2020-10-28 MED ORDER — CARIPRAZINE HCL 1.5 MG PO CAPS: 1.5000 mg | ORAL_CAPSULE | Freq: Every day | ORAL | 6 refills | Status: DC

## 2020-10-28 NOTE — Progress Notes (Signed)
Dana [name] [id number] [birth year] 50 y.o.  Subjective:   Patient ID:  Dana [name] is a 50 y.o. (DOB 1970/11/13) female.  Chief Complaint:  Chief Complaint  Patient presents with  . Follow-up  . Bipolar II disorder ([place])    Medication Refill Pertinent negatives include no diaphoresis, fatigue or weakness.   [name] presents to the office today for follow-up of bipolar disorder type II and generalized anxiety disorder and history of panic attacks.  seen August 2020.  Her symptoms were in remission with the addition of Vraylar 1.5 mg to Pristiq.  She was continued on Vraylar and Pristiq and lamotrigine was tapered as it was felt to be no longer necessary given the benefit with Vraylar.  She was encouraged to contact Dana [name] if she had any recurrence.  She wanted to try to reduce medications if possible.  seen October 02, 2019.  She reported:  Doing good.  No problems off the lamotrigine.  Still having dizzy spells off it.  Notice is more in the morning but can occur other times of days.  Probably not enough fluids.  No skipped doses of Pristiq.  Still low energy to do much and is a concern.  Normal thyroid check.  We decided on the following med change: Continue Vraylar 1.5 mg and Reduc Pristiq 25 mg daily to see if energy is better and see if it's necessary. After 4-6 weeks if ok stop it.  She called November 12, 2019 and left a message she would like to stop Vraylar because she felt like it was not helping.  She was encouraged to schedule an appointment.  As of November 28, 2019: Fine on 25 mg Pristiq but when stopped depression and anxiety and dizziness but less tired.  Restarted Pristiq and is tired again and focus is not quite as good.  Insurance on Pristiq is higher. Plan: Continue Vraylar 1.5 mg and Switch to fluoxetine 10 in hopes of less tiredness.  01/09/2020 appt, the following noted: No vaccine. Doing better with energy.  Anxiety is not like I'd like since the  switch and would like to increase fluoxetine.  Also started hormone that helped some too. Plan: Continue Vraylar 1.5 mg and Increase  fluoxetine   To 20 mg to better control anxiety and if tiredness then call and we'll reduce to 15 mg daily.  03/13/20 appt with following noted: A little less anxious but wants to increase fluoxetine further.  Tolerated it.  Still worry too much and worse than it should be.  Dep and mood swings controlled.  Sleep good. Energy no change. Sweating better with hormone. Plan: Continue Vraylar 1.5 mg and Increase  fluoxetine   To 30 mg to better control anxiety.  05/20/20 appt with following noted: Better but still not right.  Trouble focusing and being productive.  Not depressed. But low energy and motivation.  Anxiety is under control. Hormone has helped but not enough of something.  No falls or dizziness. Plan: Continue Vraylar 1.5 mg and The Increase  fluoxetine   To 30 mg to better control anxiety was successful and tolerated. Modafinil off label 100-200 mg AM  07/22/2020 appointment with the following noted: Added modafinil and not nearly as tired and better focus on 100 mg daily.  Never tried 200 mg daily.  Still fatigued. A little seasonal depression is typical but OK.  Starting to fgeel pretty normal.  Anxiety managed. Plan no med change  10/28/20 appt noted: Doing OK and  satisfied with meds.   Continues Vraylar 1.5 daiily, fluoxetine 30, modafinil 200 daily and each helpful without SE. Slowly losing weight by walking more.  Patient reports stable mood and denies depressed or irritable moods.   Patient denies difficulty with sleep initiation or maintenance. Still sleeping good.  Not needing excess sleep with modafinil.Dana [name]  H says snore some.. Denies appetite disturbance.    Better concentration including at work.  Alertness OK daytime.  Patient denies any suicidal ideation. Last panic in a long while.   Caffeine reduced to 1 large cup AM. Alcohol 1  daily.  Past Psychiatric Medication Trials: Lamotrigine 200, Vraylar 3 dizzy, Abilify  Pristiq 100 mg, sertraline, fluoxetine 30 , buspirone, Wellbutrin irritable,  Modafinil 100  Review of Systems:  Review of Systems  Constitutional: Negative for diaphoresis, fatigue and unexpected weight change.  Cardiovascular: Negative for palpitations.  Neurological: Negative for dizziness, tremors and weakness.    Medications: I have reviewed the patient's current medications.  Current Outpatient Medications  Medication Sig Dispense Refill  . Cetirizine HCl (ZYRTEC ALLERGY PO) Take 1 tablet by mouth daily as needed. For allergy.    . cholecalciferol (VITAMIN D3) 25 MCG (1000 UNIT) tablet Take 1,000 Units by mouth daily.    . TURMERIC PO Take by mouth.    . vitamin B-12 (CYANOCOBALAMIN) 1000 MCG tablet Take 1,000 mcg by mouth daily.    . cariprazine (VRAYLAR) capsule Take 1 capsule (1.5 mg total) by mouth daily. 30 capsule 6  . FLUoxetine (PROZAC) 10 MG capsule Take 1 capsule (10 mg) by mouth daily with 20 mg to equal 30 mg total 270 capsule 1  . FLUoxetine (PROZAC) 20 MG capsule Take 1 capsule (20 mg total) by mouth daily. 90 capsule 1  . modafinil (PROVIGIL) 200 MG tablet Take 1 tablet (200 mg total) by mouth daily. 30 tablet 5   No current facility-administered medications for this visit.    Medication Side Effects: None  Allergies:  Allergies  Allergen Reactions  . Penicillins Rash  . Wellbutrin [Bupropion] Other (See Comments)    "Irritable"    Past Medical History:  Diagnosis Date  . Anxiety   . Asthma   . CKD (chronic kidney disease), stage III ([place])   . Complication of anesthesia 2010   hard to wake up after colonoscopy, and increased HR  . Depression   . Frequent falls   . GERD (gastroesophageal reflux disease)    no meds  . H/O hiatal hernia   . Kyphosis   . Melanoma ([place]) 2010   left hip  . Migraine headache     Family History  Problem Relation Age of Onset   . Pancreatic cancer Mother   . Hyperlipidemia Mother   . Stroke Mother   . Dementia Father   . Uterine cancer Maternal Grandmother   . Stroke Paternal Grandmother   . Diabetes Paternal Grandfather     Social History   Socioeconomic History  . Marital status: Married    Spouse name: Simona Huh  . Number of children: 2  . Years of education: Not on file  . Highest education level: Associate degree: academic program  Occupational History    Comment: Human relations  Tobacco Use  . Smoking status: Never Smoker  . Smokeless tobacco: Never Used  Vaping Use  . Vaping Use: Never used  Substance and Sexual Activity  . Alcohol use: Yes    Comment: rare  . Drug use: No  . Sexual activity: Not on  file  Other Topics Concern  . Not on file  Social History Narrative   Lives with spouse   Caffeine- 4 daily   Social Determinants of Health   Financial Resource Strain: Not on file  Food Insecurity: Not on file  Transportation Needs: Not on file  Physical Activity: Not on file  Stress: Not on file  Social Connections: Not on file  Intimate Partner Violence: Not on file    Past Medical History, Surgical history, Social history, and Family history were reviewed and updated as appropriate.   Please see review of systems for further details on the patient's review from today.   Objective:   Physical Exam:  There were no vitals taken for this visit.  Physical Exam Constitutional:      General: She is not in acute distress.    Appearance: She is well-developed. She is obese.  Musculoskeletal:        General: No deformity.  Neurological:     Mental Status: She is alert and oriented to person, place, and time.     Coordination: Coordination normal.  Psychiatric:        Attention and Perception: Attention and perception normal. She does not perceive auditory or visual hallucinations.        Mood and Affect: Mood is not anxious or depressed. Affect is not labile, blunt, angry or  inappropriate.        Speech: Speech normal.        Behavior: Behavior normal.        Thought Content: Thought content normal. Thought content is not paranoid or delusional. Thought content does not include homicidal or suicidal ideation. Thought content does not include homicidal or suicidal plan.        Cognition and Memory: Cognition and memory normal.        Judgment: Judgment normal.     Comments: Insight intact. Generally not depressed     Lab Review:  No results found for: NA, K, CL, CO2, GLUCOSE, BUN, CREATININE, CALCIUM, PROT, ALBUMIN, AST, ALT, ALKPHOS, BILITOT, GFRNONAA, GFRAA     Component Value Date/Time   WBC 6.7 02/18/2012 1130   RBC 4.31 02/18/2012 1130   HGB 13.0 02/18/2012 1130   HCT 37.5 02/18/2012 1130   PLT 199 02/18/2012 1130   MCV 87.0 02/18/2012 1130   MCH 30.2 02/18/2012 1130   MCHC 34.7 02/18/2012 1130   RDW 13.1 02/18/2012 1130    No results found for: POCLITH, LITHIUM   No results found for: PHENYTOIN, PHENOBARB, VALPROATE, CBMZ   .res Assessment: Plan:    Shatarra was seen today for follow-up and bipolar ii disorder ([facility]).  Diagnoses and all orders for this visit:  Bipolar II disorder ([place]) -     cariprazine (VRAYLAR) capsule; Take 1 capsule (1.5 mg total) by mouth daily. -     FLUoxetine (PROZAC) 20 MG capsule; Take 1 capsule (20 mg total) by mouth daily. -     modafinil (PROVIGIL) 200 MG tablet; Take 1 tablet (200 mg total) by mouth daily.  Generalized anxiety disorder -     FLUoxetine (PROZAC) 10 MG capsule; Take 1 capsule (10 mg) by mouth daily with 20 mg to equal 30 mg total -     FLUoxetine (PROZAC) 20 MG capsule; Take 1 capsule (20 mg total) by mouth daily.  Chronic fatigue -     modafinil (PROVIGIL) 200 MG tablet; Take 1 tablet (200 mg total) by mouth daily.      Greater than  50% of30 min face to face time with patient was spent on counseling and coordination of care. We discussed She admits a history of multiple depressive  episodes.  She has been seen in our office since 2007.  There has been debate about whether she has an irritable depression or bipolar type II.  The decision was made to change her diagnosis to bipolar type II in part based on reactions to medication.  No change in diagnosis was made in 2006.  She had problems with irritability on Pristiq 100 mg daily.  Symptoms went into remission with the addition of Vraylar 1.5 mg at that time and the reduction in Pristiq.  Her symptoms have remained in good control since then.  It was discussed with her that because of the number of depressive episode she has had in her lifetime it is highly likely she will relapse without maintenance medication.  She has had good response with the Vraylar and we will expect to continue that medication.  Continue Vraylar 1.5 mg and The Increase  fluoxetine   To 30 mg to better control anxiety was successful and tolerated.  Disc SE in detail and SSRI withdrawal sx. call if depression or anxiety worsen.  Disc risk of mood cycling. Modafinil off label 100-200 mg AM.  It has been helpful  Discussed potential metabolic side effects associated with atypical antipsychotics, as well as potential risk for movement side effects. Advised pt to contact office if movement side effects occur.   FU 3-4 mos  This appt was 30 mins.  Lynder Parents MD, DFAPA  Please see After Visit Summary for patient specific instructions.  No future appointments.  No orders of the defined types were placed in this encounter.   -------------------------------

## 2021-02-27 ENCOUNTER — Telehealth: Payer: Self-pay | Admitting: Psychiatry

## 2021-02-27 NOTE — Telephone Encounter (Signed)
Next visit is 04/30/21. Dana [name] called stating that she is taking the following medications from here. They are Vraylar 1.5 mg, Fluoxetine 10 mg and 20 mg, Modafinil 200 mg. Dana [name] is experiencing picking scabs, biting nails and she can't focus. She wants to know if one of these medications is causing this? Her phone number is [phone].Pharmacy is:  [place], [place] - [address]  Phone:  [phone]  Fax:  [phone]

## 2021-02-27 NOTE — Telephone Encounter (Signed)
The last medication change was increasing fluoxetine and that would not cause these symptoms.  The prior medicine change was the addition of modafinil.  It is conceivable that modafinil and/or Vraylar could cause these type of symptoms.  The first change should be to decrease the modafinil to 1/2 tablet or 100 mg in the morning and see if that helps.  If that is the [place] the symptoms could get better in just a few days.  I am not certain that any of these meds are causing it but that would be the first change to evaluate.

## 2021-02-27 NOTE — Telephone Encounter (Signed)
Please review

## 2021-02-27 NOTE — Telephone Encounter (Signed)
Left detailed message.   

## 2021-04-30 ENCOUNTER — Other Ambulatory Visit: Payer: Self-pay

## 2021-04-30 ENCOUNTER — Encounter: Payer: Self-pay | Admitting: Psychiatry

## 2021-04-30 ENCOUNTER — Ambulatory Visit: Payer: 59 | Admitting: Psychiatry

## 2021-04-30 DIAGNOSIS — F3181 Bipolar II disorder: Secondary | ICD-10-CM

## 2021-04-30 DIAGNOSIS — F411 Generalized anxiety disorder: Secondary | ICD-10-CM

## 2021-04-30 DIAGNOSIS — R5382 Chronic fatigue, unspecified: Secondary | ICD-10-CM | POA: Diagnosis not present

## 2021-04-30 MED ORDER — CARIPRAZINE HCL 1.5 MG PO CAPS: 1.5000 mg | ORAL_CAPSULE | Freq: Every day | ORAL | 1 refills | Status: DC

## 2021-04-30 MED ORDER — FLUOXETINE HCL 40 MG PO CAPS: 40.0000 mg | ORAL_CAPSULE | Freq: Every day | ORAL | 0 refills | Status: DC

## 2021-04-30 MED ORDER — MODAFINIL 200 MG PO TABS: 200.0000 mg | ORAL_TABLET | Freq: Every day | ORAL | 2 refills | Status: DC

## 2021-04-30 NOTE — Progress Notes (Signed)
CONDA ALBU [id number] 09-05-1970 50 y.o.  Subjective:   Patient ID:  Dana [name] is a 50 y.o. (DOB 07/02/1971) female.  Chief Complaint:  Chief Complaint  Patient presents with   Follow-up   Bipolar II disorder ([place])    Medication Refill Pertinent negatives include no diaphoresis, fatigue or weakness.  [name] presents to the office today for follow-up of bipolar disorder type II and generalized anxiety disorder and history of panic attacks.  seen August 2020.  Her symptoms were in remission with the addition of Vraylar 1.5 mg to Pristiq.  She was continued on Vraylar and Pristiq and lamotrigine was tapered as it was felt to be no longer necessary given the benefit with Vraylar.  She was encouraged to contact us if she had any recurrence.  She wanted to try to reduce medications if possible.  seen October 02, 2019.  She reported:  Doing good.  No problems off the lamotrigine.  Still having dizzy spells off it.  Notice is more in the morning but can occur other times of days.  Probably not enough fluids.  No skipped doses of Pristiq.  Still low energy to do much and is a concern.  Normal thyroid check.  We decided on the following med change: Continue Vraylar 1.5 mg and Reduc Pristiq 25 mg daily to see if energy is better and see if it's necessary. After 4-6 weeks if ok stop it.  She called November 12, 2019 and left a message she would like to stop Vraylar because she felt like it was not helping.  She was encouraged to schedule an appointment.  As of November 28, 2019: Fine on 25 mg Pristiq but when stopped depression and anxiety and dizziness but less tired.  Restarted Pristiq and is tired again and focus is not quite as good.  Insurance on Pristiq is higher. Plan: Continue Vraylar 1.5 mg and Switch to fluoxetine 10 in hopes of less tiredness.  01/09/2020 appt, the following noted: No vaccine. Doing better with energy.  Anxiety is not like I'd like since the switch  and would like to increase fluoxetine.  Also started hormone that helped some too. Plan: Continue Vraylar 1.5 mg and Increase  fluoxetine   To 20 mg to better control anxiety and if tiredness then call and we'll reduce to 15 mg daily.  03/13/20 appt with following noted: A little less anxious but wants to increase fluoxetine further.  Tolerated it.  Still worry too much and worse than it should be.  Dep and mood swings controlled.  Sleep good. Energy no change. Sweating better with hormone. Plan: Continue Vraylar 1.5 mg and Increase  fluoxetine   To 30 mg to better control anxiety.  05/20/20 appt with following noted: Better but still not right.  Trouble focusing and being productive.  Not depressed. But low energy and motivation.  Anxiety is under control. Hormone has helped but not enough of something.  No falls or dizziness. Plan: Continue Vraylar 1.5 mg and The Increase  fluoxetine   To 30 mg to better control anxiety was successful and tolerated. Modafinil off label 100-200 mg AM  07/22/2020 appointment with the following noted: Added modafinil and not nearly as tired and better focus on 100 mg daily.  Never tried 200 mg daily.  Still fatigued. A little seasonal depression is typical but OK.  Starting to fgeel pretty normal.  Anxiety managed. Plan no med change  10/28/20 appt noted: Doing OK and satisfied  with meds.   Continues Vraylar 1.5 daiily, fluoxetine 30, modafinil 200 daily and each helpful without SE. Slowly losing weight by walking more. Plan: Continue Vraylar 1.5 mg and The Increase  fluoxetine   To 30 mg to better control anxiety was successful and tolerated. Modafinil off label 100-200 mg AM.  It has been helpful  04/30/2021 appointment with the following noted: More stress home and work and having more anxiety.  Still trouble focusing and not sure why.  Worry more often.  Both boys having problems.  Boss stresses her out.  Had this boss since 2008 and stressed with her the  whole time.  She's getting on her more than in the past bc pt slower than in the past.  Never tried 200 mg modafinil with some HA with 100 mg daily.  Modafinil does help tiredness. No SE with fluoxetine or Vraylar.  Patient reports stable mood and denies depressed or irritable moods.   Patient denies difficulty with sleep initiation or maintenance. Still sleeping good.  Not needing excess sleep with modafinil.. Adequate quantity.   H says snore some.. Denies appetite disturbance.    Conc is worse than before.   Alertness OK daytime.  Patient denies any suicidal ideation. Last panic in a long while.   Caffeine reduced to 1 large cup AM. Alcohol 1 daily.  Past Psychiatric Medication Trials: Lamotrigine 200, Vraylar 3 dizzy, Abilify  Pristiq 100 mg, sertraline, fluoxetine 30 , buspirone,  Wellbutrin irritable,  Modafinil 100  Review of Systems:  Review of Systems  Constitutional:  Negative for diaphoresis, fatigue and unexpected weight change.  Cardiovascular:  Negative for palpitations.  Neurological:  Negative for dizziness, tremors and weakness.  Psychiatric/Behavioral:  Positive for decreased concentration. The patient is nervous/anxious.    Medications: I have reviewed the patient's current medications.  Current Outpatient Medications  Medication Sig Dispense Refill   Cetirizine HCl (ZYRTEC ALLERGY PO) Take 1 tablet by mouth daily as needed. For allergy.     cholecalciferol (VITAMIN D3) 25 MCG (1000 UNIT) tablet Take 1,000 Units by mouth daily.     TURMERIC PO Take by mouth.     vitamin B-12 (CYANOCOBALAMIN) 1000 MCG tablet Take 1,000 mcg by mouth daily.     cariprazine (VRAYLAR) 1.5 MG capsule Take 1 capsule (1.5 mg total) by mouth daily. 30 capsule 1   FLUoxetine (PROZAC) 40 MG capsule Take 1 capsule (40 mg total) by mouth daily. 90 capsule 0   modafinil (PROVIGIL) 200 MG tablet Take 1 tablet (200 mg total) by mouth daily. 30 tablet 2   No current facility-administered medications  for this visit.    Medication Side Effects: None  Allergies:  Allergies  Allergen Reactions   Penicillins Rash   Wellbutrin [Bupropion] Other (See Comments)    "Irritable"    Past Medical History:  Diagnosis Date   Anxiety    Asthma    CKD (chronic kidney disease), stage III ([facility])    Complication of anesthesia 2010   hard to wake up after colonoscopy, and increased HR   Depression    Frequent falls    GERD (gastroesophageal reflux disease)    no meds   H/O hiatal hernia    Kyphosis    Melanoma ([place]) 2010   left hip   Migraine headache     Family History  Problem Relation Age of Onset   Pancreatic cancer Mother    Hyperlipidemia Mother    Stroke Mother    Dementia Father  Uterine cancer Maternal Grandmother    Stroke Paternal Grandmother    Diabetes Paternal Grandfather     Social History   Socioeconomic History   Marital status: Married    Spouse name: Simona Huh   Number of children: 2   Years of education: Not on file   Highest education level: Associate degree: academic program  Occupational History    Comment: Human relations  Tobacco Use   Smoking status: Never   Smokeless tobacco: Never  Vaping Use   Vaping Use: Never used  Substance and Sexual Activity   Alcohol use: Yes    Comment: rare   Drug use: No   Sexual activity: Not on file  Other Topics Concern   Not on file  Social History Narrative   Lives with spouse   Caffeine- 4 daily   Social Determinants of Health   Financial Resource Strain: Not on file  Food Insecurity: Not on file  Transportation Needs: Not on file  Physical Activity: Not on file  Stress: Not on file  Social Connections: Not on file  Intimate Partner Violence: Not on file    Past Medical History, Surgical history, Social history, and Family history were reviewed and updated as appropriate.   Please see review of systems for further details on the patient's review from today.   Objective:   Physical Exam:   There were no vitals taken for this visit.  Physical Exam Constitutional:      General: She is not in acute distress.    Appearance: She is well-developed. She is obese.  Musculoskeletal:        General: No deformity.  Neurological:     Mental Status: She is alert and oriented to person, place, and time.     Coordination: Coordination normal.  Psychiatric:        Attention and Perception: Attention and perception normal. She does not perceive auditory or visual hallucinations.        Mood and Affect: Mood is anxious. Mood is not depressed. Affect is not labile, blunt, angry or inappropriate.        Speech: Speech normal.        Behavior: Behavior normal.        Thought Content: Thought content normal. Thought content is not paranoid or delusional. Thought content does not include homicidal or suicidal ideation. Thought content does not include homicidal or suicidal plan.        Cognition and Memory: Cognition and memory normal.        Judgment: Judgment normal.     Comments: Insight intact. Generally not depressed    Lab Review:  No results found for: NA, K, CL, CO2, GLUCOSE, BUN, CREATININE, CALCIUM, PROT, ALBUMIN, AST, ALT, ALKPHOS, BILITOT, GFRNONAA, GFRAA     Component Value Date/Time   WBC 6.7 02/18/2012 1130   RBC 4.31 02/18/2012 1130   HGB 13.0 02/18/2012 1130   HCT 37.5 02/18/2012 1130   PLT 199 02/18/2012 1130   MCV 87.0 02/18/2012 1130   MCH 30.2 02/18/2012 1130   MCHC 34.7 02/18/2012 1130   RDW 13.1 02/18/2012 1130    No results found for: POCLITH, LITHIUM   No results found for: PHENYTOIN, PHENOBARB, VALPROATE, CBMZ   .res Assessment: Plan:    Sanaiah was seen today for follow-up and bipolar ii disorder ([facility]).  Diagnoses and all orders for this visit:  Bipolar II disorder ([place]) -     FLUoxetine (PROZAC) 40 MG capsule; Take 1 capsule (40 mg total)  by mouth daily. -     cariprazine (VRAYLAR) 1.5 MG capsule; Take 1 capsule (1.5 mg total) by mouth  daily. -     modafinil (PROVIGIL) 200 MG tablet; Take 1 tablet (200 mg total) by mouth daily.  Generalized anxiety disorder -     FLUoxetine (PROZAC) 40 MG capsule; Take 1 capsule (40 mg total) by mouth daily.  Chronic fatigue -     modafinil (PROVIGIL) 200 MG tablet; Take 1 tablet (200 mg total) by mouth daily.     Greater than 50% of30 min face to face time with patient was spent on counseling and coordination of care. We discussed She admits a history of multiple depressive episodes.  She has been seen in our office since 2007.  There has been debate about whether she has an irritable depression or bipolar type II.  The decision was made to change her diagnosis to bipolar type II in part based on reactions to medication.  No change in diagnosis was made in 2006.  She had problems with irritability on Pristiq 100 mg daily.  Symptoms went into remission with the addition of Vraylar 1.5 mg at that time and the reduction in Pristiq.  Her symptoms have remained in good control since then.  It was discussed with her that because of the number of depressive episode she has had in her lifetime it is highly likely she will relapse without maintenance medication.  She has had good response with the Vraylar and we will expect to continue that medication.  Continue Vraylar 1.5 mg and Increase  fluoxetine   To 40 mg to better control anxiety was successful and tolerated.  Disc SE in detail and SSRI withdrawal sx. call if depression or anxiety worsen.  Disc risk of mood cycling. Modafinil off label 100-200 mg AM.  It has been helpful but can increase to 200 if tolerated for better focus.  Discussed potential metabolic side effects associated with atypical antipsychotics, as well as potential risk for movement side effects. Advised pt to contact office if movement side effects occur.   FU 2 mos  This appt was 30 mins.  Lynder Parents MD, DFAPA  Please see After Visit Summary for patient specific  instructions.  No future appointments.  No orders of the defined types were placed in this encounter.   -------------------------------

## 2021-05-04 ENCOUNTER — Other Ambulatory Visit: Payer: Self-pay | Admitting: Psychiatry

## 2021-05-04 DIAGNOSIS — F3181 Bipolar II disorder: Secondary | ICD-10-CM

## 2021-05-04 DIAGNOSIS — F411 Generalized anxiety disorder: Secondary | ICD-10-CM

## 2021-05-19 ENCOUNTER — Other Ambulatory Visit: Payer: Self-pay | Admitting: Psychiatry

## 2021-05-19 DIAGNOSIS — F3181 Bipolar II disorder: Secondary | ICD-10-CM

## 2021-05-19 DIAGNOSIS — R5382 Chronic fatigue, unspecified: Secondary | ICD-10-CM

## 2021-07-07 ENCOUNTER — Ambulatory Visit: Payer: 59 | Admitting: Psychiatry

## 2021-07-07 ENCOUNTER — Encounter: Payer: Self-pay | Admitting: Psychiatry

## 2021-07-07 ENCOUNTER — Other Ambulatory Visit: Payer: Self-pay

## 2021-07-07 DIAGNOSIS — F3181 Bipolar II disorder: Secondary | ICD-10-CM | POA: Diagnosis not present

## 2021-07-07 DIAGNOSIS — R5382 Chronic fatigue, unspecified: Secondary | ICD-10-CM

## 2021-07-07 DIAGNOSIS — F411 Generalized anxiety disorder: Secondary | ICD-10-CM

## 2021-07-07 MED ORDER — MODAFINIL 200 MG PO TABS: 200.0000 mg | ORAL_TABLET | Freq: Every day | ORAL | 1 refills | Status: DC

## 2021-07-07 MED ORDER — CARIPRAZINE HCL 1.5 MG PO CAPS: 1.5000 mg | ORAL_CAPSULE | Freq: Every day | ORAL | 1 refills | Status: DC

## 2021-07-07 MED ORDER — FLUOXETINE HCL 40 MG PO CAPS: 40.0000 mg | ORAL_CAPSULE | Freq: Every day | ORAL | 1 refills | Status: DC

## 2021-07-07 NOTE — Progress Notes (Signed)
Dana [name] [id number] Jan 08, 1971 51 y.o.  Subjective:   Patient ID:  IRA BUSBIN is a 50 y.o. (DOB December 16, 1970) female.  Chief Complaint:  Chief Complaint  Patient presents with   Follow-up    Bipolar II disorder ([place])    Medication Refill Pertinent negatives include no chest pain, diaphoresis, fatigue or weakness.  [name] presents to the office today for follow-up of bipolar disorder type II and generalized anxiety disorder and history of panic attacks.  seen August 2020.  Her symptoms were in remission with the addition of Vraylar 1.5 mg to Pristiq.  She was continued on Vraylar and Pristiq and lamotrigine was tapered as it was felt to be no longer necessary given the benefit with Vraylar.  She was encouraged to contact us if she had any recurrence.  She wanted to try to reduce medications if possible.  seen October 02, 2019.  She reported:  Doing good.  No problems off the lamotrigine.  Still having dizzy spells off it.  Notice is more in the morning but can occur other times of days.  Probably not enough fluids.  No skipped doses of Pristiq.  Still low energy to do much and is a concern.  Normal thyroid check.  We decided on the following med change: Continue Vraylar 1.5 mg and Reduc Pristiq 25 mg daily to see if energy is better and see if it's necessary. After 4-6 weeks if ok stop it.  She called November 12, 2019 and left a message she would like to stop Vraylar because she felt like it was not helping.  She was encouraged to schedule an appointment.  As of November 28, 2019: Fine on 25 mg Pristiq but when stopped depression and anxiety and dizziness but less tired.  Restarted Pristiq and is tired again and focus is not quite as good.  Insurance on Pristiq is higher. Plan: Continue Vraylar 1.5 mg and Switch to fluoxetine 10 in hopes of less tiredness.  01/09/2020 appt, the following noted: No vaccine. Doing better with energy.  Anxiety is not like I'd like  since the switch and would like to increase fluoxetine.  Also started hormone that helped some too. Plan: Continue Vraylar 1.5 mg and Increase  fluoxetine   To 20 mg to better control anxiety and if tiredness then call and we'll reduce to 15 mg daily.  03/13/20 appt with following noted: A little less anxious but wants to increase fluoxetine further.  Tolerated it.  Still worry too much and worse than it should be.  Dep and mood swings controlled.  Sleep good. Energy no change. Sweating better with hormone. Plan: Continue Vraylar 1.5 mg and Increase  fluoxetine   To 30 mg to better control anxiety.  05/20/20 appt with following noted: Better but still not right.  Trouble focusing and being productive.  Not depressed. But low energy and motivation.  Anxiety is under control. Hormone has helped but not enough of something.  No falls or dizziness. Plan: Continue Vraylar 1.5 mg and The Increase  fluoxetine   To 30 mg to better control anxiety was successful and tolerated. Modafinil off label 100-200 mg AM  07/22/2020 appointment with the following noted: Added modafinil and not nearly as tired and better focus on 100 mg daily.  Never tried 200 mg daily.  Still fatigued. A little seasonal depression is typical but OK.  Starting to fgeel pretty normal.  Anxiety managed. Plan no med change  10/28/20 appt noted: Doing  OK and satisfied with meds.   Continues Vraylar 1.5 daiily, fluoxetine 30, modafinil 200 daily and each helpful without SE. Slowly losing weight by walking more. Plan: Continue Vraylar 1.5 mg and The Increase  fluoxetine   To 30 mg to better control anxiety was successful and tolerated. Modafinil off label 100-200 mg AM.  It has been helpful  04/30/2021 appointment with the following noted: More stress home and work and having more anxiety.  Still trouble focusing and not sure why.  Worry more often.  Both boys having problems.  Boss stresses her out.  Had this boss since 2008 and  stressed with her the whole time.  She's getting on her more than in the past bc pt slower than in the past.  Never tried 200 mg modafinil with some HA with 100 mg daily.  Modafinil does help tiredness. No SE with fluoxetine or Vraylar. Plan: Continue Vraylar 1.5 mg and Increase  fluoxetine   To 40 mg to better control anxiety was successful and tolerated. Modafinil off label 100-200 mg AM.  It has been helpful but can increase to 200 if tolerated for better focus.  07/07/2021 appointment with the following noted: Done better.  Anxiety and mood are better.  Conc better with modafinil. Tolerated well without SE. On modafinil 200 mg AM  Patient reports stable mood and denies depressed or irritable moods.   Patient denies difficulty with sleep initiation or maintenance. Still sleeping good.  Not needing excess sleep with modafinil.. Adequate quantity.   H says snore some.. Denies appetite disturbance.    Conc is worse than before.   Alertness OK daytime.  Patient denies any suicidal ideation. Last panic in a long while.   Caffeine reduced to 1 large cup AM. Alcohol 1 daily.  Past Psychiatric Medication Trials: Lamotrigine 200, Vraylar 3 dizzy, Abilify  Pristiq 100 mg, sertraline, fluoxetine 30 , buspirone,  Wellbutrin irritable,  Modafinil 100  Review of Systems:  Review of Systems  Constitutional:  Negative for diaphoresis, fatigue and unexpected weight change.  Cardiovascular:  Negative for chest pain and palpitations.  Neurological:  Negative for dizziness, tremors and weakness.  Psychiatric/Behavioral:  Negative for decreased concentration. The patient is not nervous/anxious.    Medications: I have reviewed the patient's current medications.  Current Outpatient Medications  Medication Sig Dispense Refill   cariprazine (VRAYLAR) 1.5 MG capsule Take 1 capsule (1.5 mg total) by mouth daily. 30 capsule 1   Cetirizine HCl (ZYRTEC ALLERGY PO) Take 1 tablet by mouth daily as needed. For  allergy.     cholecalciferol (VITAMIN D3) 25 MCG (1000 UNIT) tablet Take 1,000 Units by mouth daily.     FLUoxetine (PROZAC) 40 MG capsule Take 1 capsule (40 mg total) by mouth daily. 90 capsule 0   modafinil (PROVIGIL) 200 MG tablet Take 1 tablet by mouth once daily 30 tablet 1   TURMERIC PO Take by mouth.     vitamin B-12 (CYANOCOBALAMIN) 1000 MCG tablet Take 1,000 mcg by mouth daily.     No current facility-administered medications for this visit.    Medication Side Effects: None  Allergies:  Allergies  Allergen Reactions   Penicillins Rash   Wellbutrin [Bupropion] Other (See Comments)    "Irritable"    Past Medical History:  Diagnosis Date   Anxiety    Asthma    CKD (chronic kidney disease), stage III ([place])    Complication of anesthesia 2010   hard to wake up after colonoscopy, and increased HR  Depression    Frequent falls    GERD (gastroesophageal reflux disease)    no meds   H/O hiatal hernia    Kyphosis    Melanoma ([place]) 2010   left hip   Migraine headache     Family History  Problem Relation Age of Onset   Pancreatic cancer Mother    Hyperlipidemia Mother    Stroke Mother    Dementia Father    Uterine cancer Maternal Grandmother    Stroke Paternal Grandmother    Diabetes Paternal Grandfather     Social History   Socioeconomic History   Marital status: Married    Spouse name: Simona Huh   Number of children: 2   Years of education: Not on file   Highest education level: Associate degree: academic program  Occupational History    Comment: Human relations  Tobacco Use   Smoking status: Never   Smokeless tobacco: Never  Vaping Use   Vaping Use: Never used  Substance and Sexual Activity   Alcohol use: Yes    Comment: rare   Drug use: No   Sexual activity: Not on file  Other Topics Concern   Not on file  Social History Narrative   Lives with spouse   Caffeine- 4 daily   Social Determinants of Health   Financial Resource Strain: Not on file   Food Insecurity: Not on file  Transportation Needs: Not on file  Physical Activity: Not on file  Stress: Not on file  Social Connections: Not on file  Intimate Partner Violence: Not on file    Past Medical History, Surgical history, Social history, and Family history were reviewed and updated as appropriate.   Please see review of systems for further details on the patient's review from today.   Objective:   Physical Exam:  There were no vitals taken for this visit.  Physical Exam Constitutional:      General: She is not in acute distress.    Appearance: She is well-developed. She is obese.  Musculoskeletal:        General: No deformity.  Neurological:     Mental Status: She is alert and oriented to person, place, and time.     Coordination: Coordination normal.  Psychiatric:        Attention and Perception: Attention and perception normal. She does not perceive auditory or visual hallucinations.        Mood and Affect: Mood is not anxious or depressed. Affect is not labile, blunt, angry or inappropriate.        Speech: Speech normal.        Behavior: Behavior normal.        Thought Content: Thought content normal. Thought content is not paranoid or delusional. Thought content does not include homicidal or suicidal ideation. Thought content does not include homicidal or suicidal plan.        Cognition and Memory: Cognition and memory normal.        Judgment: Judgment normal.     Comments: Insight intact. Generally not depressed    Lab Review:  No results found for: NA, K, CL, CO2, GLUCOSE, BUN, CREATININE, CALCIUM, PROT, ALBUMIN, AST, ALT, ALKPHOS, BILITOT, GFRNONAA, GFRAA     Component Value Date/Time   WBC 6.7 02/18/2012 1130   RBC 4.31 02/18/2012 1130   HGB 13.0 02/18/2012 1130   HCT 37.5 02/18/2012 1130   PLT 199 02/18/2012 1130   MCV 87.0 02/18/2012 1130   MCH 30.2 02/18/2012 1130   MCHC 34.7  02/18/2012 1130   RDW 13.1 02/18/2012 1130    No results found  for: POCLITH, LITHIUM   No results found for: PHENYTOIN, PHENOBARB, VALPROATE, CBMZ   .res Assessment: Plan:    Via was seen today for follow-up.  Diagnoses and all orders for this visit:  Bipolar II disorder ([place])  Generalized anxiety disorder  Chronic fatigue     Greater than 50% of30 min face to face time with patient was spent on counseling and coordination of care. We discussed She admits a history of multiple depressive episodes.  She has been seen in our office since 2007.  There has been debate about whether she has an irritable depression or bipolar type II.  The decision was made to change her diagnosis to bipolar type II in part based on reactions to medication.  No change in diagnosis was made in 2006.  She had problems with irritability on Pristiq 100 mg daily.  Symptoms went into remission with the addition of Vraylar 1.5 mg at that time and the reduction in Pristiq.  Her symptoms have remained in good control since then.  It was discussed with her that because of the number of depressive episode she has had in her lifetime it is highly likely she will relapse without maintenance medication.  She has had good response with the Vraylar and we will expect to continue that medication.  Continue Vraylar 1.5 mg and continue fluoxetine  40 mg bc better control anxiety was successful and tolerated.  Disc SE in detail and SSRI withdrawal sx. call if depression or anxiety worsen.  Disc risk of mood cycling. Modafinil off label  200 mg AM.  It has been helpful for better focus and energy.  Discussed potential metabolic side effects associated with atypical antipsychotics, as well as potential risk for movement side effects. Advised pt to contact office if movement side effects occur.   FU 4-6 mos  This appt was 30 mins.  Lynder Parents MD, DFAPA  Please see After Visit Summary for patient specific instructions.  No future appointments.  No orders of the defined types were  placed in this encounter.   -------------------------------

## 2022-01-05 ENCOUNTER — Ambulatory Visit: Payer: 59 | Admitting: Psychiatry

## 2022-01-05 ENCOUNTER — Encounter: Payer: Self-pay | Admitting: Psychiatry

## 2022-01-05 DIAGNOSIS — R5382 Chronic fatigue, unspecified: Secondary | ICD-10-CM | POA: Diagnosis not present

## 2022-01-05 DIAGNOSIS — F3181 Bipolar II disorder: Secondary | ICD-10-CM | POA: Diagnosis not present

## 2022-01-05 DIAGNOSIS — F411 Generalized anxiety disorder: Secondary | ICD-10-CM | POA: Diagnosis not present

## 2022-01-05 MED ORDER — FLUOXETINE HCL 40 MG PO CAPS: 40.0000 mg | ORAL_CAPSULE | Freq: Every day | ORAL | 1 refills | Status: DC

## 2022-01-05 MED ORDER — MODAFINIL 200 MG PO TABS: 200.0000 mg | ORAL_TABLET | Freq: Every day | ORAL | 1 refills | Status: DC

## 2022-01-05 MED ORDER — CARIPRAZINE HCL 1.5 MG PO CAPS: 1.5000 mg | ORAL_CAPSULE | Freq: Every day | ORAL | 1 refills | Status: DC

## 2022-01-05 NOTE — Progress Notes (Signed)
Dana [name] ?[id number] ?1971-01-15 ?51 y.o. ? ?Subjective:  ? ?Patient ID:  Dana [name] is a 51 y.o. (DOB January 23, 1971) female. ? ?Chief Complaint:  ?Chief Complaint  ?Patient presents with  ? Follow-up  ? ? ?Medication Refill ?Pertinent negatives include no chest pain, diaphoresis, fatigue or weakness.  ?Dana [name] presents to the office today for follow-up of bipolar disorder type II and generalized anxiety disorder and history of panic attacks. ? ?seen August 2020.  Her symptoms were in remission with the addition of Vraylar 1.5 mg to Pristiq.  She was continued on Vraylar and Pristiq and lamotrigine was tapered as it was felt to be no longer necessary given the benefit with Vraylar.  She was encouraged to contact us if she had any recurrence.  She wanted to try to reduce medications if possible. ? ?seen October 02, 2019.  She reported:  Doing good.  No problems off the lamotrigine.  Still having dizzy spells off it.  Notice is more in the morning but can occur other times of days.  Probably not enough fluids.  No skipped doses of Pristiq.  Still low energy to do much and is a concern.  Normal thyroid check.  ?We decided on the following med change: ?Continue Vraylar 1.5 mg and ?Reduc Pristiq 25 mg daily to see if energy is better and see if it's necessary. ?After 4-6 weeks if ok stop it. ? ?She called November 12, 2019 and left a message she would like to stop Vraylar because she felt like it was not helping.  She was encouraged to schedule an appointment. ? ?As of November 28, 2019: Fine on 25 mg Pristiq but when stopped depression and anxiety and dizziness but less tired.  Restarted Pristiq and is tired again and focus is not quite as good.  Insurance on Pristiq is higher. ?Plan: Continue Vraylar 1.5 mg and ?Switch to fluoxetine 10 in hopes of less tiredness. ? ?01/09/2020 appt, the following noted: ?No vaccine. ?Doing better with energy.  Anxiety is not like I'd like since the switch and would like  to increase fluoxetine.  Also started hormone that helped some too. ?Plan: Continue Vraylar 1.5 mg and ?Increase  fluoxetine   To 20 mg to better control anxiety and if tiredness then call and we'll reduce to 15 mg daily. ? ?03/13/20 appt with following noted: ?A little less anxious but wants to increase fluoxetine further.  Tolerated it.  Still worry too much and worse than it should be.  Dep and mood swings controlled.  Sleep good. Energy no change. Sweating better with hormone. ?Plan: Continue Vraylar 1.5 mg and ?Increase  fluoxetine   To 30 mg to better control anxiety. ? ?05/20/20 appt with following noted: ?Better but still not right.  Trouble focusing and being productive.  Not depressed. But low energy and motivation.  Anxiety is under control. ?Hormone has helped but not enough of something.  No falls or dizziness. ?Plan: Continue Vraylar 1.5 mg and ?The Increase  fluoxetine   To 30 mg to better control anxiety was successful and tolerated. ?Modafinil off label 100-200 mg AM ? ?07/22/2020 appointment with the following noted: ?Added modafinil and not nearly as tired and better focus on 100 mg daily.  Never tried 200 mg daily.  Still fatigued. ?A little seasonal depression is typical but OK.  Starting to fgeel pretty normal.  Anxiety managed. ?Plan no med change ? ?10/28/20 appt noted: ?Doing OK and satisfied with meds.   ?  Continues Vraylar 1.5 daiily, fluoxetine 30, modafinil 200 daily and each helpful without SE. Slowly losing weight by walking more. ?Plan: Continue Vraylar 1.5 mg and ?The Increase  fluoxetine   To 30 mg to better control anxiety was successful and tolerated. ?Modafinil off label 100-200 mg AM.  It has been helpful ? ?04/30/2021 appointment with the following noted: ?More stress home and work and having more anxiety.  Still trouble focusing and not sure why.  Worry more often.  Both boys having problems.  Boss stresses her out.  Had this boss since 2008 and stressed with her the whole time.   She's getting on her more than in the past bc pt slower than in the past.  Never tried 200 mg modafinil with some HA with 100 mg daily.  Modafinil does help tiredness. ?No SE with fluoxetine or Vraylar. ?Plan: Continue Vraylar 1.5 mg and ?Increase  fluoxetine   To 40 mg to better control anxiety was successful and tolerated. ?Modafinil off label 100-200 mg AM.  It has been helpful but can increase to 200 if tolerated for better focus. ? ?07/07/2021 appointment with the following noted: ?Done better.  Anxiety and mood are better.  Conc better with modafinil. ?Tolerated well without SE. ?On modafinil 200 mg AM ?Plan: no med changes ?Continue Vraylar 1.5 mg and ?continue fluoxetine  40 mg bc better control anxiety was successful and tolerated. ?Modafinil off label  200 mg AM.  It has been helpful for better focus and energy. ? ?01/05/2022 appointment with the following noted: ?Dana [name] reports still doing well without med complaints. ?No SE.  And less than with the other meds she tried. ? ?Patient reports stable mood and denies depressed or irritable moods.   Patient denies difficulty with sleep initiation or maintenance. Still sleeping good.  Not needing excess sleep with modafinil.. Adequate quantity.   H says snore some.. Denies appetite disturbance.    Conc is worse than before.   Alertness OK daytime.  Patient denies any suicidal ideation. ?Last panic in a long while.  ? ?Caffeine reduced to 1 large cup AM. Alcohol 1 daily. ? ?Past Psychiatric Medication Trials: Lamotrigine 200, Vraylar 3 dizzy, Abilify  ?Pristiq 100 mg, sertraline, fluoxetine 30 , buspirone,  ?Wellbutrin irritable,  ?Modafinil 200 ? ?Review of Systems:  ?Review of Systems  ?Constitutional:  Negative for diaphoresis, fatigue and unexpected weight change.  ?Cardiovascular:  Negative for chest pain and palpitations.  ?Musculoskeletal:   ?     Ankle sprain  ?Neurological:  Negative for dizziness, tremors and weakness.  ?Psychiatric/Behavioral:  Negative  for decreased concentration. The patient is not nervous/anxious.   ? ?Medications: I have reviewed the patient's current medications. ? ?Current Outpatient Medications  ?Medication Sig Dispense Refill  ? Cetirizine HCl (ZYRTEC ALLERGY PO) Take 1 tablet by mouth daily as needed. For allergy.    ? cholecalciferol (VITAMIN D3) 25 MCG (1000 UNIT) tablet Take 1,000 Units by mouth daily.    ? Estradiol 0.75 MG/0.75GM GEL Divigel 0.75 mg/0.75 gram (0.1%) transdermal gel packet ? APPLY 1 PACKET TOPICALLY ONCE DAILY    ? TURMERIC PO Take by mouth.    ? vitamin B-12 (CYANOCOBALAMIN) 1000 MCG tablet Take 1,000 mcg by mouth daily.    ? cariprazine (VRAYLAR) 1.5 MG capsule Take 1 capsule (1.5 mg total) by mouth daily. 90 capsule 1  ? FLUoxetine (PROZAC) 40 MG capsule Take 1 capsule (40 mg total) by mouth daily. 90 capsule 1  ? modafinil (PROVIGIL) 200 MG  tablet Take 1 tablet (200 mg total) by mouth daily. 90 tablet 1  ? ?No current facility-administered medications for this visit.  ? ? ?Medication Side Effects: None ? ?Allergies:  ?Allergies  ?Allergen Reactions  ? Penicillins Rash  ? Wellbutrin [Bupropion] Other (See Comments)  ?  "Irritable"  ? ? ?Past Medical History:  ?Diagnosis Date  ? Anxiety   ? Asthma   ? CKD (chronic kidney disease), stage III ([place])   ? Complication of anesthesia 2010  ? hard to wake up after colonoscopy, and increased HR  ? Depression   ? Frequent falls   ? GERD (gastroesophageal reflux disease)   ? no meds  ? H/O hiatal hernia   ? Kyphosis   ? Melanoma ([place]) 2010  ? left hip  ? Migraine headache   ? ? ?Family History  ?Problem Relation Age of Onset  ? Pancreatic cancer Mother   ? Hyperlipidemia Mother   ? Stroke Mother   ? Dementia Father   ? Uterine cancer Maternal Grandmother   ? Stroke Paternal Grandmother   ? Diabetes Paternal Grandfather   ? ? ?Social History  ? ?Socioeconomic History  ? Marital status: Married  ?  Spouse name: Simona Huh  ? Number of children: 2  ? Years of education: Not on file  ?  Highest education level: Associate degree: academic program  ?Occupational History  ?  Comment: Human relations  ?Tobacco Use  ? Smoking status: Never  ? Smokeless tobacco: Never  ?Vaping Use  ? Vaping [place]

## 2022-07-08 ENCOUNTER — Encounter: Payer: Self-pay | Admitting: Psychiatry

## 2022-07-08 ENCOUNTER — Ambulatory Visit: Payer: 59 | Admitting: Psychiatry

## 2022-07-08 DIAGNOSIS — F411 Generalized anxiety disorder: Secondary | ICD-10-CM

## 2022-07-08 DIAGNOSIS — R5382 Chronic fatigue, unspecified: Secondary | ICD-10-CM

## 2022-07-08 DIAGNOSIS — F3181 Bipolar II disorder: Secondary | ICD-10-CM | POA: Diagnosis not present

## 2022-07-08 MED ORDER — FLUOXETINE HCL 40 MG PO CAPS: 40.0000 mg | ORAL_CAPSULE | Freq: Every day | ORAL | 1 refills | Status: DC

## 2022-07-08 MED ORDER — CARIPRAZINE HCL 1.5 MG PO CAPS: 1.5000 mg | ORAL_CAPSULE | Freq: Every day | ORAL | 2 refills | Status: DC

## 2022-07-08 MED ORDER — MODAFINIL 200 MG PO TABS: 200.0000 mg | ORAL_TABLET | Freq: Every day | ORAL | 1 refills | Status: DC

## 2022-07-08 NOTE — Progress Notes (Signed)
Dana [name] [id number] [birth year] 51 y.o.  Subjective:   Patient ID:  Dana [name] is a 51 y.o. (DOB [birth year]) female.  Chief Complaint:  Chief Complaint  Patient presents with   Follow-up    Bipolar II disorder ([place])   Anxiety    Medication Refill Pertinent negatives include no chest pain, diaphoresis or fatigue.  Anxiety Patient reports no chest pain, decreased concentration, dizziness, nervous/anxious behavior or palpitations.     Dana [name] presents to the office today for follow-up of bipolar disorder type II and generalized anxiety disorder and history of panic attacks.  seen August 2020.  Her symptoms were in remission with the addition of Vraylar 1.5 mg to Pristiq.  She was continued on Vraylar and Pristiq and lamotrigine was tapered as it was felt to be no longer necessary given the benefit with Vraylar.  She was encouraged to contact us if she had any recurrence.  She wanted to try to reduce medications if possible.  seen October 02, 2019.  She reported:  Doing good.  No problems off the lamotrigine.  Still having dizzy spells off it.  Notice is more in the morning but can occur other times of days.  Probably not enough fluids.  No skipped doses of Pristiq.  Still low energy to do much and is a concern.  Normal thyroid check.  We decided on the following med change: Continue Vraylar 1.5 mg and Reduc Pristiq 25 mg daily to see if energy is better and see if it's necessary. After 4-6 weeks if ok stop it.  She called November 12, 2019 and left a message she would like to stop Vraylar because she felt like it was not helping.  She was encouraged to schedule an appointment.  As of November 28, 2019: Fine on 25 mg Pristiq but when stopped depression and anxiety and dizziness but less tired.  Restarted Pristiq and is tired again and focus is not quite as good.  Insurance on Pristiq is higher. Plan: Continue Vraylar 1.5 mg and Switch to fluoxetine 10 in hopes of  less tiredness.  01/09/2020 appt, the following noted: No vaccine. Doing better with energy.  Anxiety is not like I'd like since the switch and would like to increase fluoxetine.  Also started hormone that helped some too. Plan: Continue Vraylar 1.5 mg and Increase  fluoxetine   To 20 mg to better control anxiety and if tiredness then [name] and we'll reduce to 15 mg daily.  03/13/20 appt with following noted: A little less anxious but wants to increase fluoxetine further.  Tolerated it.  Still worry too much and worse than it should be.  Dep and mood swings controlled.  Sleep good. Energy no change. Sweating better with hormone. Plan: Continue Vraylar 1.5 mg and Increase  fluoxetine   To 30 mg to better control anxiety.  05/20/20 appt with following noted: Better but still not right.  Trouble focusing and being productive.  Not depressed. But low energy and motivation.  Anxiety is under control. Hormone has helped but not enough of something.  No falls or dizziness. Plan: Continue Vraylar 1.5 mg and The Increase  fluoxetine   To 30 mg to better control anxiety was successful and tolerated. Modafinil off label 100-200 mg AM  07/22/2020 appointment with the following noted: Added modafinil and not nearly as tired and better focus on 100 mg daily.  Never tried 200 mg daily.  Still fatigued. A little seasonal depression is typical but  OK.  Starting to fgeel pretty normal.  Anxiety managed. Plan no med change  10/28/20 appt noted: Doing OK and satisfied with meds.   Continues Vraylar 1.5 daiily, fluoxetine 30, modafinil 200 daily and each helpful without SE. Slowly losing weight by walking more. Plan: Continue Vraylar 1.5 mg and The Increase  fluoxetine   To 30 mg to better control anxiety was successful and tolerated. Modafinil off label 100-200 mg AM.  It has been helpful  04/30/2021 appointment with the following noted: More stress home and work and having more anxiety.  Still trouble  focusing and not sure why.  Worry more often.  Both boys having problems.  Boss stresses her out.  Had this boss since 2008 and stressed with her the whole time.  She's getting on her more than in the past bc pt slower than in the past.  Never tried 200 mg modafinil with some HA with 100 mg daily.  Modafinil does help tiredness. No SE with fluoxetine or Vraylar. Plan: Continue Vraylar 1.5 mg and Increase  fluoxetine   To 40 mg to better control anxiety was successful and tolerated. Modafinil off label 100-200 mg AM.  It has been helpful but can increase to 200 if tolerated for better focus.  07/07/2021 appointment with the following noted: Done better.  Anxiety and mood are better.  Conc better with modafinil. Tolerated well without SE. On modafinil 200 mg AM Plan: no med changes Continue Vraylar 1.5 mg and continue fluoxetine  40 mg bc better control anxiety was successful and tolerated. Modafinil off label  200 mg AM.  It has been helpful for better focus and energy.  01/05/2022 appointment with the following noted: Dana [name] reports still doing well without med complaints. No SE.  And less than with the other meds she tried. Patient reports stable mood and denies depressed or irritable moods.   Patient denies difficulty with sleep initiation or maintenance. Still sleeping good.  Not needing excess sleep with modafinil.. Adequate quantity.   H says snore some.. Denies appetite disturbance.    Conc is worse than before.   Alertness OK daytime.  Patient denies any suicidal ideation. Last panic in a long while.  Plan: No med change  07/08/2022 appointment noted: Dana [name] comes to the office today for follow-up.  She continues to medications as noted above. Doing fine.  No further falls after med changes.  Anxiety is good. Patient reports stable mood and denies depressed or irritable moods.  Patient denies any recent difficulty with anxiety.  Patient denies difficulty with sleep initiation or  maintenance. Denies appetite disturbance.  Patient reports that energy and motivation have been good.  Patient denies any difficulty with concentration.  Patient denies any suicidal Ideation. No current SE. Health OK.  Working on wt loss.    Caffeine reduced to 1 large cup AM. Alcohol 1 daily.  Past Psychiatric Medication Trials: Lamotrigine 200, Vraylar 3 dizzy, Abilify  Pristiq 100 mg SE dizzy and withdrawal and falls,  sertraline, fluoxetine 30 , buspirone,  Wellbutrin irritable,  Modafinil 200  Review of Systems:  Review of Systems  Constitutional:  Negative for diaphoresis and fatigue.  Cardiovascular:  Negative for chest pain and palpitations.  Musculoskeletal:        Ankle sprain  Neurological:  Negative for dizziness and tremors.  Psychiatric/Behavioral:  Negative for decreased concentration. The patient is not nervous/anxious.     Medications: I have reviewed the patient's current medications.  Current Outpatient Medications  Medication Sig  Dispense Refill   Cetirizine HCl (ZYRTEC ALLERGY PO) Take 1 tablet by mouth daily as needed. For allergy.     cholecalciferol (VITAMIN D3) 25 MCG (1000 UNIT) tablet Take 1,000 Units by mouth daily.     Estradiol 0.75 MG/0.75GM GEL Divigel 0.75 mg/0.75 gram (0.1%) transdermal gel packet  APPLY 1 PACKET TOPICALLY ONCE DAILY     TURMERIC PO Take by mouth.     vitamin B-12 (CYANOCOBALAMIN) 1000 MCG tablet Take 1,000 mcg by mouth daily.     cariprazine (VRAYLAR) 1.5 MG capsule Take 1 capsule (1.5 mg total) by mouth daily. 90 capsule 2   FLUoxetine (PROZAC) 40 MG capsule Take 1 capsule (40 mg total) by mouth daily. 90 capsule 1   modafinil (PROVIGIL) 200 MG tablet Take 1 tablet (200 mg total) by mouth daily. 90 tablet 1   No current facility-administered medications for this visit.    Medication Side Effects: None  Allergies:  Allergies  Allergen Reactions   Penicillins Rash   Wellbutrin [Bupropion] Other (See Comments)     "Irritable"    Past Medical History:  Diagnosis Date   Anxiety    Asthma    CKD (chronic kidney disease), stage III ([facility])    Complication of anesthesia 2010   hard to wake up after colonoscopy, and increased HR   Depression    Frequent falls    GERD (gastroesophageal reflux disease)    no meds   H/O hiatal hernia    Kyphosis    Melanoma ([place]) 2010   left hip   Migraine headache     Family History  Problem Relation Age of Onset   Pancreatic cancer Mother    Hyperlipidemia Mother    Stroke Mother    Dementia Father    Uterine cancer Maternal Grandmother    Stroke Paternal Grandmother    Diabetes Paternal Grandfather     Social History   Socioeconomic History   Marital status: Married    Spouse name: Simona Huh   Number of children: 2   Years of education: Not on file   Highest education level: Associate degree: academic program  Occupational History    Comment: Human relations  Tobacco Use   Smoking status: Never   Smokeless tobacco: Never  Vaping Use   Vaping Use: Never used  Substance and Sexual Activity   Alcohol use: Yes    Comment: rare   Drug use: No   Sexual activity: Not on file  Other Topics Concern   Not on file  Social History Narrative   Lives with spouse   Caffeine- 4 daily   Social Determinants of Health   Financial Resource Strain: Not on file  Food Insecurity: Not on file  Transportation Needs: Not on file  Physical Activity: Not on file  Stress: Not on file  Social Connections: Not on file  Intimate Partner Violence: Not on file    Past Medical History, Surgical history, Social history, and Family history were reviewed and updated as appropriate.   Please see review of systems for further details on the patient's review from today.   Objective:   Physical Exam:  There were no vitals taken for this visit.  Physical Exam Constitutional:      General: She is not in acute distress.    Appearance: She is well-developed. She is  obese.  Musculoskeletal:        General: No deformity.  Neurological:     Mental Status: She is alert and oriented to  person, place, and time.     Coordination: Coordination normal.  Psychiatric:        Attention and Perception: Attention and perception normal. She does not perceive auditory or visual hallucinations.        Mood and Affect: Mood is not anxious, depressed or elated. Affect is not labile, angry or inappropriate.        Speech: Speech normal.        Behavior: Behavior normal.        Thought Content: Thought content normal. Thought content is not paranoid or delusional. Thought content does not include homicidal or suicidal ideation. Thought content does not include suicidal plan.        Cognition and Memory: Cognition and memory normal.        Judgment: Judgment normal.     Comments: Insight intact.      Lab Review:  No results found for: "NA", "K", "CL", "CO2", "GLUCOSE", "BUN", "CREATININE", "CALCIUM", "PROT", "ALBUMIN", "AST", "ALT", "ALKPHOS", "BILITOT", "GFRNONAA", "GFRAA"     Component Value Date/Time   WBC 6.7 02/18/2012 1130   RBC 4.31 02/18/2012 1130   HGB 13.0 02/18/2012 1130   HCT 37.5 02/18/2012 1130   PLT 199 02/18/2012 1130   MCV 87.0 02/18/2012 1130   MCH 30.2 02/18/2012 1130   MCHC 34.7 02/18/2012 1130   RDW 13.1 02/18/2012 1130    No results found for: "POCLITH", "LITHIUM"   No results found for: "PHENYTOIN", "PHENOBARB", "VALPROATE", "CBMZ"   .res Assessment: Plan:    Cosette was seen today for follow-up and anxiety.  Diagnoses and all orders for this visit:  Bipolar II disorder ([place]) -     cariprazine (VRAYLAR) 1.5 MG capsule; Take 1 capsule (1.5 mg total) by mouth daily. -     FLUoxetine (PROZAC) 40 MG capsule; Take 1 capsule (40 mg total) by mouth daily. -     modafinil (PROVIGIL) 200 MG tablet; Take 1 tablet (200 mg total) by mouth daily.  Generalized anxiety disorder -     FLUoxetine (PROZAC) 40 MG capsule; Take 1 capsule (40 mg  total) by mouth daily.  Chronic fatigue -     modafinil (PROVIGIL) 200 MG tablet; Take 1 tablet (200 mg total) by mouth daily.      Greater than 50% of30 min face to face time with patient was spent on counseling and coordination of care. We discussed She admits a history of multiple depressive episodes.  She has been seen in our office since 2007.  There has been debate about whether she has an irritable depression or bipolar type II.  The decision was made to change her diagnosis to bipolar type II in part based on reactions to medication.  No change in diagnosis was made in 2006.  She had problems with irritability on Pristiq 100 mg daily.  Symptoms went into remission with the addition of Vraylar 1.5 mg at that time and the reduction in Pristiq.  Her symptoms have remained in good control since then.  It was discussed with her that because of the number of depressive episode she has had in her lifetime it is highly likely she will relapse without maintenance medication.  She has had good response with the Vraylar and we will expect to continue that medication. Fluoxetine increased from 30-40 in September 2022 brought the anxiety under control.  Disc purpose of each med.  Continue Vraylar 1.5 mg and continue fluoxetine  40 mg bc better control anxiety was successful and tolerated.  Disc SE in detail and SSRI withdrawal sx. [name] if depression or anxiety worsen.  Disc risk of mood cycling. Modafinil off label  200 mg AM.  It has been helpful for better focus and energy.  Discussed potential metabolic side effects associated with atypical antipsychotics, as well as potential risk for movement side effects. Advised pt to contact office if movement side effects occur.   FU 9 mos bc of stability  Lynder Parents MD, DFAPA  Please see After Visit Summary for patient specific instructions.  No future appointments.    No orders of the defined types were placed in this  encounter.   -------------------------------

## 2022-10-21 ENCOUNTER — Other Ambulatory Visit: Payer: Self-pay

## 2022-10-21 ENCOUNTER — Emergency Department (HOSPITAL_COMMUNITY)
Admission: EM | Admit: 2022-10-21 | Discharge: 2022-10-21 | Discharge status: Against Medical Advice | Payer: 59 | Attending: Emergency Medicine | Admitting: Emergency Medicine

## 2022-10-21 ENCOUNTER — Encounter (HOSPITAL_COMMUNITY): Payer: Self-pay

## 2022-10-21 ENCOUNTER — Emergency Department (HOSPITAL_COMMUNITY): Payer: 59

## 2022-10-21 DIAGNOSIS — R079 Chest pain, unspecified: Secondary | ICD-10-CM | POA: Diagnosis present

## 2022-10-21 DIAGNOSIS — R0602 Shortness of breath: Secondary | ICD-10-CM | POA: Diagnosis not present

## 2022-10-21 DIAGNOSIS — Z5321 Procedure and treatment not carried out due to patient leaving prior to being seen by health care provider: Secondary | ICD-10-CM | POA: Insufficient documentation

## 2022-10-21 DIAGNOSIS — R42 Dizziness and giddiness: Secondary | ICD-10-CM | POA: Diagnosis not present

## 2022-10-21 LAB — CBC
HCT: 40.2 % (ref 36.0–46.0)
Hemoglobin: 13.8 g/dL (ref 12.0–15.0)
MCH: 30.1 pg (ref 26.0–34.0)
MCHC: 34.3 g/dL (ref 30.0–36.0)
MCV: 87.8 fL (ref 80.0–100.0)
Platelets: 302 10*3/uL (ref 150–400)
RBC: 4.58 MIL/uL (ref 3.87–5.11)
RDW: 13.1 % (ref 11.5–15.5)
WBC: 8.9 10*3/uL (ref 4.0–10.5)
nRBC: 0 % (ref 0.0–0.2)

## 2022-10-21 LAB — BASIC METABOLIC PANEL
Anion gap: 11 (ref 5–15)
BUN: 17 mg/dL (ref 6–20)
CO2: 23 mmol/L (ref 22–32)
Calcium: 9.2 mg/dL (ref 8.9–10.3)
Chloride: 104 mmol/L (ref 98–111)
Creatinine, Ser: 1.11 mg/dL — ABNORMAL HIGH (ref 0.44–1.00)
GFR, Estimated: 59 mL/min — ABNORMAL LOW (ref >60)
Glucose, Bld: 106 mg/dL — ABNORMAL HIGH (ref 70–99)
Potassium: 3.5 mmol/L (ref 3.5–5.1)
Sodium: 138 mmol/L (ref 135–145)

## 2022-10-21 LAB — I-STAT BETA HCG BLOOD, ED (MC, WL, AP ONLY): I-stat hCG, quantitative: <5 m[IU]/mL (ref <5)

## 2022-10-21 LAB — TROPONIN I (HIGH SENSITIVITY): Troponin I (High Sensitivity): 3 ng/L (ref <18)

## 2022-10-21 NOTE — ED Triage Notes (Signed)
Pt c/o non radiating midsternal chest tightness, SOB, lightheadednessx2wks int. Pt is eupneic. Pt states she got dizzy 2-3 times today

## 2022-10-21 NOTE — ED Provider Triage Note (Signed)
Emergency Medicine Provider Triage Evaluation Note  Dana [name] , a 52 y.o. female  was evaluated in triage.  Pt complains of chest pain, shortness of breath.  Patient reports that she has been experiencing chest pain shortness of breath over the last 2 weeks.  No increased respiratory effort.  Reports that she has been getting dizzy about 2-3 times today.  Has not noticed any specific inducing events that caused the symptoms.  No prior history of any cardiovascular disease or hypertension.  No prior history of clots or pulmonary embolism.  Not on blood thinners.  Review of Systems  Positive: As above Negative: As above  Physical Exam  BP 137/85   Pulse 95   Temp 97.7 F (36.5 C) (Oral)   Resp 17   Ht 5' 2.5" (1.588 m)   Wt 82.1 kg   SpO2 98%   BMI 32.58 kg/m  Gen:   Awake, anxious Resp:  Normal effort  MSK:   Moves extremities without difficulty  Other:   Medical Decision Making  Medically screening exam initiated at 4:05 PM.  Appropriate orders placed.  Dana [name] was informed that the remainder of the evaluation will be completed by another provider, this initial triage assessment does not replace that evaluation, and the importance of remaining in the ED until their evaluation is complete.     Dana Heller, PA-C 10/21/22 1610

## 2023-01-08 ENCOUNTER — Other Ambulatory Visit: Payer: Self-pay | Admitting: Psychiatry

## 2023-01-08 DIAGNOSIS — F3181 Bipolar II disorder: Secondary | ICD-10-CM

## 2023-01-08 DIAGNOSIS — R5382 Chronic fatigue, unspecified: Secondary | ICD-10-CM

## 2023-02-08 ENCOUNTER — Other Ambulatory Visit: Payer: Self-pay

## 2023-02-08 DIAGNOSIS — F3181 Bipolar II disorder: Secondary | ICD-10-CM

## 2023-02-08 DIAGNOSIS — F411 Generalized anxiety disorder: Secondary | ICD-10-CM

## 2023-02-08 MED ORDER — FLUOXETINE HCL 40 MG PO CAPS: 40.0000 mg | ORAL_CAPSULE | Freq: Every day | ORAL | 0 refills | Status: DC

## 2023-04-11 ENCOUNTER — Other Ambulatory Visit: Payer: Self-pay | Admitting: Psychiatry

## 2023-04-11 DIAGNOSIS — F3181 Bipolar II disorder: Secondary | ICD-10-CM

## 2023-04-11 DIAGNOSIS — R5382 Chronic fatigue, unspecified: Secondary | ICD-10-CM

## 2023-04-12 ENCOUNTER — Encounter: Payer: Self-pay | Admitting: Psychiatry

## 2023-04-12 ENCOUNTER — Ambulatory Visit: Payer: 59 | Admitting: Psychiatry

## 2023-04-12 DIAGNOSIS — F3181 Bipolar II disorder: Secondary | ICD-10-CM

## 2023-04-12 DIAGNOSIS — F411 Generalized anxiety disorder: Secondary | ICD-10-CM

## 2023-04-12 DIAGNOSIS — R5382 Chronic fatigue, unspecified: Secondary | ICD-10-CM

## 2023-04-12 MED ORDER — CARIPRAZINE HCL 1.5 MG PO CAPS: 1.5000 mg | ORAL_CAPSULE | Freq: Every day | ORAL | 3 refills | Status: DC

## 2023-04-12 MED ORDER — MODAFINIL 200 MG PO TABS: 200.0000 mg | ORAL_TABLET | Freq: Every day | ORAL | 1 refills | Status: DC

## 2023-04-12 MED ORDER — FLUOXETINE HCL 40 MG PO CAPS: 40.0000 mg | ORAL_CAPSULE | Freq: Every day | ORAL | 3 refills | Status: DC

## 2023-04-12 NOTE — Progress Notes (Signed)
Dana [name] [id number] [birth year] 52 y.o.  Subjective:   Patient ID:  Dana [name] is a 52 y.o. (DOB Sep 29, 1970) female.  Chief Complaint:  Chief Complaint  Patient presents with   Follow-up    Mood and anxiety    Medication Refill Pertinent negatives include no chest pain, diaphoresis or fatigue.  Anxiety Patient reports no chest pain, decreased concentration, dizziness, nervous/anxious behavior or palpitations.     Dana [name] presents to the office today for follow-up of bipolar disorder type II and generalized anxiety disorder and history of panic attacks.  seen August 2020.  Her symptoms were in remission with the addition of Vraylar 1.5 mg to Pristiq.  She was continued on Vraylar and Pristiq and lamotrigine was tapered as it was felt to be no longer necessary given the benefit with Vraylar.  She was encouraged to contact us if she had any recurrence.  She wanted to try to reduce medications if possible.  seen October 02, 2019.  She reported:  Doing good.  No problems off the lamotrigine.  Still having dizzy spells off it.  Notice is more in the morning but can occur other times of days.  Probably not enough fluids.  No skipped doses of Pristiq.  Still low energy to do much and is a concern.  Normal thyroid check.  We decided on the following med change: Continue Vraylar 1.5 mg and Reduc Pristiq 25 mg daily to see if energy is better and see if it's necessary. After 4-6 weeks if ok stop it.  She called November 12, 2019 and left a message she would like to stop Vraylar because she felt like it was not helping.  She was encouraged to schedule an appointment.  As of November 28, 2019: Fine on 25 mg Pristiq but when stopped depression and anxiety and dizziness but less tired.  Restarted Pristiq and is tired again and focus is not quite as good.  Insurance on Pristiq is higher. Plan: Continue Vraylar 1.5 mg and Switch to fluoxetine 10 in hopes of less  tiredness.  01/09/2020 appt, the following noted: No vaccine. Doing better with energy.  Anxiety is not like I'd like since the switch and would like to increase fluoxetine.  Also started hormone that helped some too. Plan: Continue Vraylar 1.5 mg and Increase  fluoxetine   To 20 mg to better control anxiety and if tiredness then call and we'll reduce to 15 mg daily.  03/13/20 appt with following noted: A little less anxious but wants to increase fluoxetine further.  Tolerated it.  Still worry too much and worse than it should be.  Dep and mood swings controlled.  Sleep good. Energy no change. Sweating better with hormone. Plan: Continue Vraylar 1.5 mg and Increase  fluoxetine   To 30 mg to better control anxiety.  05/20/20 appt with following noted: Better but still not right.  Trouble focusing and being productive.  Not depressed. But low energy and motivation.  Anxiety is under control. Hormone has helped but not enough of something.  No falls or dizziness. Plan: Continue Vraylar 1.5 mg and The Increase  fluoxetine   To 30 mg to better control anxiety was successful and tolerated. Modafinil off label 100-200 mg AM  07/22/2020 appointment with the following noted: Added modafinil and not nearly as tired and better focus on 100 mg daily.  Never tried 200 mg daily.  Still fatigued. A little seasonal depression is typical but OK.  Starting to  fgeel pretty normal.  Anxiety managed. Plan no med change  10/28/20 appt noted: Doing OK and satisfied with meds.   Continues Vraylar 1.5 daiily, fluoxetine 30, modafinil 200 daily and each helpful without SE. Slowly losing weight by walking more. Plan: Continue Vraylar 1.5 mg and The Increase  fluoxetine   To 30 mg to better control anxiety was successful and tolerated. Modafinil off label 100-200 mg AM.  It has been helpful  04/30/2021 appointment with the following noted: More stress home and work and having more anxiety.  Still trouble focusing and  not sure why.  Worry more often.  Both boys having problems.  Boss stresses her out.  Had this boss since 2008 and stressed with her the whole time.  She's getting on her more than in the past bc pt slower than in the past.  Never tried 200 mg modafinil with some HA with 100 mg daily.  Modafinil does help tiredness. No SE with fluoxetine or Vraylar. Plan: Continue Vraylar 1.5 mg and Increase  fluoxetine   To 40 mg to better control anxiety was successful and tolerated. Modafinil off label 100-200 mg AM.  It has been helpful but can increase to 200 if tolerated for better focus.  07/07/2021 appointment with the following noted: Done better.  Anxiety and mood are better.  Conc better with modafinil. Tolerated well without SE. On modafinil 200 mg AM Plan: no med changes Continue Vraylar 1.5 mg and continue fluoxetine  40 mg bc better control anxiety was successful and tolerated. Modafinil off label  200 mg AM.  It has been helpful for better focus and energy.  01/05/2022 appointment with the following noted: Dana [name] reports still doing well without med complaints. No SE.  And less than with the other meds she tried. Patient reports stable mood and denies depressed or irritable moods.   Patient denies difficulty with sleep initiation or maintenance. Still sleeping good.  Not needing excess sleep with modafinil.. Adequate quantity.   H says snore some.. Denies appetite disturbance.    Conc is worse than before.   Alertness OK daytime.  Patient denies any suicidal ideation. Last panic in a long while.  Plan: No med change  07/08/2022 appointment noted: Dana [name] comes to the office today for follow-up.  She continues to medications as noted above. Doing fine.  No further falls after med changes.  Anxiety is good. Patient reports stable mood and denies depressed or irritable moods.  Patient denies any recent difficulty with anxiety.  Patient denies difficulty with sleep initiation or maintenance. Denies  appetite disturbance.  Patient reports that energy and motivation have been good.  Patient denies any difficulty with concentration.  Patient denies any suicidal Ideation. No current SE. Health OK.  Working on wt loss.   Plan: Continue Vraylar 1.5 mg and continue fluoxetine  40 mg bc better control anxiety was successful and tolerated. Modafinil off label  200 mg AM.  It has been helpful for better focus and energy.  04/12/23 appt noted:  Continues meds.  No SE Doing ok.  Feeling good without depression.  Anxiety is ok and manageable.  Modafinil is clearly helpful.  No napping.   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 Sleep is not as good without reason unless menopause.  Sometimes EMA but once weekly.  Some hot flashes but don't wake her.  Don't know reason for awakening.   Caffeine reduced to 1 large cup AM. Alcohol 1 daily.  Past Psychiatric Medication Trials: Lamotrigine 200, Vraylar 3 dizzy, Abilify  Pristiq 100 mg SE dizzy and withdrawal and falls,  sertraline, fluoxetine 30 , buspirone,  Wellbutrin irritable,  Modafinil 200 Seen here since 2007  Review of Systems:  Review of Systems  Constitutional:  Negative for diaphoresis and fatigue.  Cardiovascular:  Negative for chest pain and palpitations.  Musculoskeletal:        Ankle sprain  Neurological:  Negative for dizziness and tremors.  Psychiatric/Behavioral:  Positive for sleep disturbance. Negative for decreased concentration. The patient is not nervous/anxious.     Medications: I have reviewed the patient's current medications.  Current Outpatient Medications  Medication Sig Dispense Refill   Cetirizine HCl (ZYRTEC ALLERGY PO) Take 1 tablet by mouth daily as needed. For allergy.     cholecalciferol (VITAMIN D3) 25 MCG (1000 UNIT) tablet Take 1,000 Units by mouth daily.     Estradiol 0.75 MG/0.75GM GEL Divigel 0.75 mg/0.75 gram (0.1%) transdermal gel packet  APPLY 1 PACKET TOPICALLY ONCE DAILY      TURMERIC PO Take by mouth.     vitamin B-12 (CYANOCOBALAMIN) 1000 MCG tablet Take 1,000 mcg by mouth daily.     cariprazine (VRAYLAR) 1.5 MG capsule Take 1 capsule (1.5 mg total) by mouth daily. 90 capsule 3   FLUoxetine (PROZAC) 40 MG capsule Take 1 capsule (40 mg total) by mouth daily. 90 capsule 3   modafinil (PROVIGIL) 200 MG tablet Take 1 tablet (200 mg total) by mouth daily. 90 tablet 1   No current facility-administered medications for this visit.    Medication Side Effects: None  Allergies:  Allergies  Allergen Reactions   Penicillins Rash   Wellbutrin [Bupropion] Other (See Comments)    "Irritable"    Past Medical History:  Diagnosis Date   Anxiety    Asthma    CKD (chronic kidney disease), stage III (HCC)    Complication of anesthesia 2010   hard to wake up after colonoscopy, and increased HR   Depression    Frequent falls    GERD (gastroesophageal reflux disease)    no meds   H/O hiatal hernia    Kyphosis    Melanoma (HCC) 2010   left hip   Migraine headache     Family History  Problem Relation Age of Onset   Pancreatic cancer Mother    Hyperlipidemia Mother    Stroke Mother    Dementia Father    Uterine cancer Maternal Grandmother    Stroke Paternal Grandmother    Diabetes Paternal Grandfather     Social History   Socioeconomic History   Marital status: Married    Spouse name: Maurine Minister   Number of children: 2   Years of education: Not on file   Highest education level: Associate degree: academic program  Occupational History    Comment: Human relations  Tobacco Use   Smoking status: Never   Smokeless tobacco: Never  Vaping Use   Vaping status: Never Used  Substance and Sexual Activity   Alcohol use: Yes    Comment: rare   Drug use: No   Sexual activity: Not on file  Other Topics Concern   Not on file  Social History Narrative   Lives with spouse   Caffeine- 4 daily   Social Determinants of Health   Financial Resource Strain: Not  on file  Food Insecurity: Not on file  Transportation Needs: Not on file  Physical Activity: Not on file  Stress: Not on file  Social Connections: Not on file  Intimate Partner Violence: Not on file    Past Medical History, Surgical history, Social history, and Family history were reviewed and updated as appropriate.   Please see review of systems for further details on the patient's review from today.   Objective:   Physical Exam:  There were no vitals taken for this visit.  Physical Exam Constitutional:      General: She is not in acute distress.    Appearance: She is well-developed. She is obese.  Musculoskeletal:        General: No deformity.  Neurological:     Mental Status: She is alert and oriented to person, place, and time.     Coordination: Coordination normal.  Psychiatric:        Attention and Perception: Attention and perception normal. She does not perceive auditory or visual hallucinations.        Mood and Affect: Mood is not anxious or depressed. Affect is not labile or angry.        Speech: Speech normal.        Behavior: Behavior normal.        Thought Content: Thought content normal. Thought content is not paranoid or delusional. Thought content does not include homicidal or suicidal ideation. Thought content does not include suicidal plan.        Cognition and Memory: Cognition and memory normal.        Judgment: Judgment normal.     Comments: Insight intact.      Lab Review:     Component Value Date/Time   NA 138 10/21/2022 1558   K 3.5 10/21/2022 1558   CL 104 10/21/2022 1558   CO2 23 10/21/2022 1558   GLUCOSE 106 (H) 10/21/2022 1558   BUN 17 10/21/2022 1558   CREATININE 1.11 (H) 10/21/2022 1558   CALCIUM 9.2 10/21/2022 1558   GFRNONAA 59 (L) 10/21/2022 1558       Component Value Date/Time   WBC 8.9 10/21/2022 1558   RBC 4.58 10/21/2022 1558   HGB 13.8 10/21/2022 1558   HCT 40.2 10/21/2022 1558   PLT 302 10/21/2022 1558   MCV 87.8  10/21/2022 1558   MCH 30.1 10/21/2022 1558   MCHC 34.3 10/21/2022 1558   RDW 13.1 10/21/2022 1558    No results found for: "POCLITH", "LITHIUM"   No results found for: "PHENYTOIN", "PHENOBARB", "VALPROATE", "CBMZ"   .res Assessment: Plan:    Dana [name] was seen today for follow-up.  Diagnoses and all orders for this visit:  Bipolar II disorder (HCC) -     cariprazine (VRAYLAR) 1.5 MG capsule; Take 1 capsule (1.5 mg total) by mouth daily. -     FLUoxetine (PROZAC) 40 MG capsule; Take 1 capsule (40 mg total) by mouth daily. -     modafinil (PROVIGIL) 200 MG tablet; Take 1 tablet (200 mg total) by mouth daily.  Generalized anxiety disorder -     FLUoxetine (PROZAC) 40 MG capsule; Take 1 capsule (40 mg total) by mouth daily.  Chronic fatigue -     modafinil (PROVIGIL) 200 MG tablet; Take 1 tablet (200 mg total) by mouth daily.     30 min face to face time with patient was spent on counseling and coordination of care. We discussed She admits a history of multiple depressive episodes.  She has been seen in our office since 2007.  There has been debate about whether she has an irritable depression or bipolar type II.  The decision  was made to change her diagnosis to bipolar type II in part based on reactions to medication.  No change in diagnosis was made in 2006.  She had problems with irritability on Pristiq 100 mg daily.  Symptoms went into remission with the addition of Vraylar 1.5 mg at that time and the reduction in Pristiq.  Her symptoms have remained in good control since then.  It was discussed with her that because of the number of depressive episode she has had in her lifetime it is highly likely she will relapse without maintenance medication.  She has had good response with the Vraylar and we will expect to continue that medication. Fluoxetine, in place of Pristiq,  increased from 30-40 in September 2022 brought the anxiety under control.  Disc SE in detail and SSRI withdrawal sx.  call if depression or anxiety worsen.  Disc risk of mood cycling.  Plan no med changes Continue Vraylar 1.5 mg and continue fluoxetine  40 mg bc better control anxiety was successful and tolerated. Modafinil off label  200 mg AM.  It has been helpful for better focus and energy.  Discussed potential metabolic side effects associated with atypical antipsychotics, as well as potential risk for movement side effects. Advised pt to contact office if movement side effects occur.  No AIM noted  FU 12 mos bc of stability  Meredith Staggers MD, DFAPA  Please see After Visit Summary for patient specific instructions.  No future appointments.    No orders of the defined types were placed in this encounter.   -------------------------------

## 2023-05-04 ENCOUNTER — Other Ambulatory Visit (HOSPITAL_BASED_OUTPATIENT_CLINIC_OR_DEPARTMENT_OTHER): Payer: Self-pay | Admitting: Family Medicine

## 2023-05-04 DIAGNOSIS — R0789 Other chest pain: Secondary | ICD-10-CM

## 2023-05-10 ENCOUNTER — Other Ambulatory Visit: Payer: Self-pay | Admitting: Psychiatry

## 2023-05-10 DIAGNOSIS — R5382 Chronic fatigue, unspecified: Secondary | ICD-10-CM

## 2023-05-10 DIAGNOSIS — F3181 Bipolar II disorder: Secondary | ICD-10-CM

## 2023-05-10 DIAGNOSIS — F411 Generalized anxiety disorder: Secondary | ICD-10-CM

## 2023-05-12 ENCOUNTER — Ambulatory Visit (HOSPITAL_BASED_OUTPATIENT_CLINIC_OR_DEPARTMENT_OTHER): Admission: RE | Admit: 2023-05-12 | Payer: 59 | Source: Ambulatory Visit

## 2023-08-26 ENCOUNTER — Ambulatory Visit (HOSPITAL_COMMUNITY): Payer: 59

## 2023-08-26 ENCOUNTER — Ambulatory Visit (HOSPITAL_BASED_OUTPATIENT_CLINIC_OR_DEPARTMENT_OTHER)
Admission: RE | Admit: 2023-08-26 | Discharge: 2023-08-26 | Disposition: A | Discharge status: Home / Self Care | Payer: Self-pay | Source: Ambulatory Visit | Attending: Family Medicine | Admitting: Family Medicine

## 2023-08-26 ENCOUNTER — Encounter (HOSPITAL_COMMUNITY): Payer: Self-pay

## 2023-08-26 DIAGNOSIS — R0789 Other chest pain: Secondary | ICD-10-CM

## 2023-09-02 ENCOUNTER — Inpatient Hospital Stay (HOSPITAL_BASED_OUTPATIENT_CLINIC_OR_DEPARTMENT_OTHER): Admission: RE | Admit: 2023-09-02 | Payer: Self-pay | Source: Ambulatory Visit

## 2023-09-08 ENCOUNTER — Ambulatory Visit (HOSPITAL_BASED_OUTPATIENT_CLINIC_OR_DEPARTMENT_OTHER)
Admission: RE | Admit: 2023-09-08 | Discharge: 2023-09-08 | Disposition: A | Discharge status: Home / Self Care | Payer: Self-pay | Source: Ambulatory Visit | Attending: Family Medicine | Admitting: Family Medicine

## 2023-09-08 DIAGNOSIS — R0789 Other chest pain: Secondary | ICD-10-CM | POA: Insufficient documentation

## 2023-10-14 ENCOUNTER — Other Ambulatory Visit: Payer: Self-pay | Admitting: Psychiatry

## 2023-10-14 DIAGNOSIS — F3181 Bipolar II disorder: Secondary | ICD-10-CM

## 2023-10-14 DIAGNOSIS — R5382 Chronic fatigue, unspecified: Secondary | ICD-10-CM

## 2023-10-14 NOTE — Telephone Encounter (Signed)
The third is correct. I just go down the calendar and count weeks. You can pend it for 3/3, but would need to be sent to the provider a few days before then since it is mail order.

## 2024-04-07 ENCOUNTER — Other Ambulatory Visit: Payer: Self-pay | Admitting: Psychiatry

## 2024-04-07 DIAGNOSIS — R5382 Chronic fatigue, unspecified: Secondary | ICD-10-CM

## 2024-04-07 DIAGNOSIS — F3181 Bipolar II disorder: Secondary | ICD-10-CM

## 2024-04-12 ENCOUNTER — Ambulatory Visit: Payer: 59 | Admitting: Psychiatry

## 2024-04-12 ENCOUNTER — Encounter: Payer: Self-pay | Admitting: Psychiatry

## 2024-04-12 DIAGNOSIS — R5382 Chronic fatigue, unspecified: Secondary | ICD-10-CM | POA: Diagnosis not present

## 2024-04-12 DIAGNOSIS — F3181 Bipolar II disorder: Secondary | ICD-10-CM

## 2024-04-12 DIAGNOSIS — F411 Generalized anxiety disorder: Secondary | ICD-10-CM | POA: Diagnosis not present

## 2024-04-12 MED ORDER — FLUOXETINE HCL 40 MG PO CAPS: 40.0000 mg | ORAL_CAPSULE | Freq: Every day | ORAL | 3 refills | Status: AC

## 2024-04-12 MED ORDER — MODAFINIL 200 MG PO TABS: 200.0000 mg | ORAL_TABLET | Freq: Every day | ORAL | 1 refills | Status: DC

## 2024-04-12 MED ORDER — CARIPRAZINE HCL 1.5 MG PO CAPS: 1.5000 mg | ORAL_CAPSULE | Freq: Every day | ORAL | 3 refills | Status: AC

## 2024-04-12 NOTE — Progress Notes (Signed)
 Dana [name] [id number] 02-23-1971 53 y.o.  Subjective:   Patient ID:  Dana [name] is a 53 y.o. (DOB November 16, 1970) female.  Chief Complaint:  Chief Complaint  Patient presents with   Follow-up   Depression   Anxiety   Fatigue    Dana [name] presents to the office today for follow-up of bipolar disorder type II and generalized anxiety disorder and history of panic attacks.  seen August 2020.  Her symptoms were in remission with the addition of Vraylar  1.5 mg to Pristiq .  She was continued on Vraylar  and Pristiq  and lamotrigine  was tapered as it was felt to be no longer necessary given the benefit with Vraylar .  She was encouraged to contact us  if she had any recurrence.  She wanted to try to reduce medications if possible.  seen October 02, 2019.  She reported:  Doing good.  No problems off the lamotrigine .  Still having dizzy spells off it.  Notice is more in the morning but can occur other times of days.  Probably not enough fluids.  No skipped doses of Pristiq .  Still low energy to do much and is a concern.  Normal thyroid check.  We decided on the following med change: Continue Vraylar  1.5 mg and Reduc Pristiq  25 mg daily to see if energy is better and see if it's necessary. After 4-6 weeks if ok stop it.  She called November 12, 2019 and left a message she would like to stop Vraylar  because she felt like it was not helping.  She was encouraged to schedule an appointment.  As of November 28, 2019: Fine on 25 mg Pristiq  but when stopped depression and anxiety and dizziness but less tired.  Restarted Pristiq  and is tired again and focus is not quite as good.  Insurance on Pristiq  is higher. Plan: Continue Vraylar  1.5 mg and Switch to fluoxetine  10 in hopes of less tiredness.  01/09/2020 appt, the following noted: No vaccine. Doing better with energy.  Anxiety is not like I'd like since the switch and would like to increase fluoxetine .  Also started hormone that helped some  too. Plan: Continue Vraylar  1.5 mg and Increase  fluoxetine    To 20 mg to better control anxiety and if tiredness then call and we'll reduce to 15 mg daily.  03/13/20 appt with following noted: A little less anxious but wants to increase fluoxetine  further.  Tolerated it.  Still worry too much and worse than it should be.  Dep and mood swings controlled.  Sleep good. Energy no change. Sweating better with hormone. Plan: Continue Vraylar  1.5 mg and Increase  fluoxetine    To 30 mg to better control anxiety.  05/20/20 appt with following noted: Better but still not right.  Trouble focusing and being productive.  Not depressed. But low energy and motivation.  Anxiety is under control. Hormone has helped but not enough of something.  No falls or dizziness. Plan: Continue Vraylar  1.5 mg and The Increase  fluoxetine    To 30 mg to better control anxiety was successful and tolerated. Modafinil  off label 100-200 mg AM  07/22/2020 appointment with the following noted: Added modafinil  and not nearly as tired and better focus on 100 mg daily.  Never tried 200 mg daily.  Still fatigued. A little seasonal depression is typical but OK.  Starting to fgeel pretty normal.  Anxiety managed. Plan no med change  10/28/20 appt noted: Doing OK and satisfied with meds.   Continues Vraylar  1.5 daiily,  fluoxetine  30, modafinil  200 daily and each helpful without SE. Slowly losing weight by walking more. Plan: Continue Vraylar  1.5 mg and The Increase  fluoxetine    To 30 mg to better control anxiety was successful and tolerated. Modafinil  off label 100-200 mg AM.  It has been helpful  04/30/2021 appointment with the following noted: More stress home and work and having more anxiety.  Still trouble focusing and not sure why.  Worry more often.  Both boys having problems.  Boss stresses her out.  Had this boss since 2008 and stressed with her the whole time.  She's getting on her more than in the past bc pt slower than in  the past.  Never tried 200 mg modafinil  with some HA with 100 mg daily.  Modafinil  does help tiredness. No SE with fluoxetine  or Vraylar . Plan: Continue Vraylar  1.5 mg and Increase  fluoxetine    To 40 mg to better control anxiety was successful and tolerated. Modafinil  off label 100-200 mg AM.  It has been helpful but can increase to 200 if tolerated for better focus.  07/07/2021 appointment with the following noted: Done better.  Anxiety and mood are better.  Conc better with modafinil . Tolerated well without SE. On modafinil  200 mg AM Plan: no med changes Continue Vraylar  1.5 mg and continue fluoxetine   40 mg bc better control anxiety was successful and tolerated. Modafinil  off label  200 mg AM.  It has been helpful for better focus and energy.  01/05/2022 appointment with the following noted: Dana [name] reports still doing well without med complaints. No SE.  And less than with the other meds she tried. Patient reports stable mood and denies depressed or irritable moods.   Patient denies difficulty with sleep initiation or maintenance. Still sleeping good.  Not needing excess sleep with modafinil .. Adequate quantity.   H says snore some.. Denies appetite disturbance.    Conc is worse than before.   Alertness OK daytime.  Patient denies any suicidal ideation. Last panic in a long while.  Plan: No med change  07/08/2022 appointment noted: Dana [name] comes to the office today for follow-up.  She continues to medications as noted above. Doing fine.  No further falls after med changes.  Anxiety is good. Patient reports stable mood and denies depressed or irritable moods.  Patient denies any recent difficulty with anxiety.  Patient denies difficulty with sleep initiation or maintenance. Denies appetite disturbance.  Patient reports that energy and motivation have been good.  Patient denies any difficulty with concentration.  Patient denies any suicidal Ideation. No current SE. Health OK.  Working on wt  loss.   Plan: Continue Vraylar  1.5 mg and continue fluoxetine   40 mg bc better control anxiety was successful and tolerated. Modafinil  off label  200 mg AM.  It has been helpful for better focus and energy.  04/12/23 appt noted:  Continues meds.  No SE Doing ok.  Feeling good without depression.  Anxiety is ok and manageable.  Modafinil  is clearly helpful.  No napping.   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 Sleep is not as good without reason unless menopause.  Sometimes EMA but once weekly.  Some hot flashes but don't wake her.  Don't know reason for awakening.  Plan: Plan no med changes Continue Vraylar  1.5 mg and continue fluoxetine   40 mg bc better control anxiety was successful and tolerated. Modafinil  off label  200 mg AM.  It has been helpful for better  focus and energy.  04/12/24 appt noted:  Med:  No SE. Doing ok.  A little anxious over son going to [facility].  No intrusive thoughts nor images Not depressed.  No panic.   Work is a lot better with Writer.  Sleep is good normal hours.   Health is good.  Working on wt loss.   Good.     Caffeine reduced to 1 large cup AM. Alcohol 1 daily.  Past Psychiatric Medication Trials: Lamotrigine  200, Vraylar  3 dizzy, Abilify  Pristiq  100 mg SE dizzy and withdrawal and falls,  sertraline, fluoxetine  30 , buspirone,  Wellbutrin irritable,  Modafinil  200 Seen here since 2007  Review of Systems:  Review of Systems  Constitutional:  Negative for diaphoresis and fatigue.  Cardiovascular:  Negative for chest pain and palpitations.  Musculoskeletal:        Ankle sprain  Neurological:  Negative for dizziness and tremors.  Psychiatric/Behavioral:  Positive for sleep disturbance. Negative for decreased concentration. The patient is not nervous/anxious.     Medications: I have reviewed the patient's current medications.  Current Outpatient Medications  Medication Sig Dispense Refill   Cetirizine HCl (ZYRTEC  ALLERGY PO) Take 1 tablet by mouth daily as needed. For allergy.     cholecalciferol (VITAMIN D3) 25 MCG (1000 UNIT) tablet Take 1,000 Units by mouth daily.     Estradiol 0.75 MG/0.75GM GEL Divigel 0.75 mg/0.75 gram (0.1%) transdermal gel packet  APPLY 1 PACKET TOPICALLY ONCE DAILY     TURMERIC PO Take by mouth.     vitamin B-12 (CYANOCOBALAMIN) 1000 MCG tablet Take 1,000 mcg by mouth daily.     cariprazine  (VRAYLAR ) 1.5 MG capsule Take 1 capsule (1.5 mg total) by mouth daily. 90 capsule 3   FLUoxetine  (PROZAC ) 40 MG capsule Take 1 capsule (40 mg total) by mouth daily. 90 capsule 3   modafinil  (PROVIGIL ) 200 MG tablet Take 1 tablet (200 mg total) by mouth daily. 90 tablet 1   No current facility-administered medications for this visit.    Medication Side Effects: None  Allergies:  Allergies  Allergen Reactions   Penicillins Rash   Wellbutrin [Bupropion] Other (See Comments)    Irritable    Past Medical History:  Diagnosis Date   Anxiety    Asthma    CKD (chronic kidney disease), stage III (HCC)    Complication of anesthesia 2010   hard to wake up after colonoscopy, and increased HR   Depression    Frequent falls    GERD (gastroesophageal reflux disease)    no meds   H/O hiatal hernia    Kyphosis    Melanoma (HCC) 2010   left hip   Migraine headache     Family History  Problem Relation Age of Onset   Pancreatic cancer Mother    Hyperlipidemia Mother    Stroke Mother    Dementia Father    Uterine cancer Maternal Grandmother    Stroke Paternal Grandmother    Diabetes Paternal Grandfather     Social History   Socioeconomic History   Marital status: Married    Spouse name: Dana [name]   Number of children: 2   Years of education: Not on file   Highest education level: Associate degree: academic program  Occupational History    Comment: Human relations  Tobacco Use   Smoking status: Never   Smokeless tobacco: Never  Vaping Use   Vaping status: Never Used   Substance and Sexual Activity   Alcohol use:  Yes    Comment: rare   Drug use: No   Sexual activity: Not on file  Other Topics Concern   Not on file  Social History Narrative   Lives with spouse   Caffeine- 4 daily   Social Drivers of Health   Financial Resource Strain: Not on file  Food Insecurity: Not on file  Transportation Needs: Not on file  Physical Activity: Not on file  Stress: Not on file  Social Connections: Not on file  Intimate Partner Violence: Not on file    Past Medical History, Surgical history, Social history, and Family history were reviewed and updated as appropriate.   Please see review of systems for further details on the patient's review from today.   Objective:   Physical Exam:  There were no vitals taken for this visit.  Physical Exam Constitutional:      General: She is not in acute distress.    Appearance: She is well-developed. She is obese.  Musculoskeletal:        General: No deformity.  Neurological:     Mental Status: She is alert and oriented to person, place, and time.     Coordination: Coordination normal.  Psychiatric:        Attention and Perception: Attention and perception normal. She does not perceive auditory or visual hallucinations.        Mood and Affect: Mood is not anxious or depressed. Affect is not labile or angry.        Speech: Speech normal.        Behavior: Behavior normal.        Thought Content: Thought content normal. Thought content is not paranoid or delusional. Thought content does not include homicidal or suicidal ideation. Thought content does not include suicidal plan.        Cognition and Memory: Cognition and memory normal.        Judgment: Judgment normal.     Comments: Insight intact. No AIM     Lab Review:     Component Value Date/Time   NA 138 10/21/2022 1558   K 3.5 10/21/2022 1558   CL 104 10/21/2022 1558   CO2 23 10/21/2022 1558   GLUCOSE 106 (H) 10/21/2022 1558   BUN 17 10/21/2022 1558    CREATININE 1.11 (H) 10/21/2022 1558   CALCIUM 9.2 10/21/2022 1558   GFRNONAA 59 (L) 10/21/2022 1558       Component Value Date/Time   WBC 8.9 10/21/2022 1558   RBC 4.58 10/21/2022 1558   HGB 13.8 10/21/2022 1558   HCT 40.2 10/21/2022 1558   PLT 302 10/21/2022 1558   MCV 87.8 10/21/2022 1558   MCH 30.1 10/21/2022 1558   MCHC 34.3 10/21/2022 1558   RDW 13.1 10/21/2022 1558    No results found for: POCLITH, LITHIUM   No results found for: PHENYTOIN, PHENOBARB, VALPROATE, CBMZ   .res Assessment: Plan:    Dana [name] was seen today for follow-up, depression, anxiety and fatigue.  Diagnoses and all orders for this visit:  Bipolar II disorder (HCC) -     cariprazine  (VRAYLAR ) 1.5 MG capsule; Take 1 capsule (1.5 mg total) by mouth daily. -     modafinil  (PROVIGIL ) 200 MG tablet; Take 1 tablet (200 mg total) by mouth daily. -     FLUoxetine  (PROZAC ) 40 MG capsule; Take 1 capsule (40 mg total) by mouth daily.  Generalized anxiety disorder -     FLUoxetine  (PROZAC ) 40 MG capsule; Take 1 capsule (40 mg total)  by mouth daily.  Chronic fatigue -     modafinil  (PROVIGIL ) 200 MG tablet; Take 1 tablet (200 mg total) by mouth daily.      30 min face to face time with patient was spent on counseling and coordination of care. We discussed She admits a history of multiple depressive episodes.  She has been seen in our office since 2007.  There has been debate about whether she has an irritable depression or bipolar type II.  The decision was made to change her diagnosis to bipolar type II in part based on reactions to medication.  No change in diagnosis was made in 2006.  She had problems with irritability on Pristiq  100 mg daily.  Symptoms went into remission with the addition of Vraylar  1.5 mg at that time and the reduction in Pristiq .  Her symptoms have remained in good control since then.  It was discussed with her that because of the number of depressive episode she has had in  her lifetime it is highly likely she will relapse without maintenance medication.  She has had good response with the Vraylar  and we will expect to continue that medication. Fluoxetine , in place of Pristiq ,  increased from 30-40 in September 2022 brought the anxiety under control.  Disc SE in detail and SSRI withdrawal sx. call if depression or anxiety worsen.  Disc risk of mood cycling.  Plan no med changes Continue Vraylar  1.5 mg and continue fluoxetine   40 mg bc better control anxiety was successful and tolerated. Modafinil  off label  200 mg AM.  It has been helpful for better focus and energy.  Discussed potential metabolic side effects associated with atypical antipsychotics, as well as potential risk for movement side effects. Advised pt to contact office if movement side effects occur.  No AIM noted  FU 12 mos bc of stability  Lorene Macintosh MD, DFAPA  Please see After Visit Summary for patient specific instructions.  No future appointments.    No orders of the defined types were placed in this encounter.   -------------------------------

## 2024-06-25 ENCOUNTER — Other Ambulatory Visit: Payer: Self-pay | Admitting: Psychiatry

## 2024-06-25 DIAGNOSIS — R5382 Chronic fatigue, unspecified: Secondary | ICD-10-CM

## 2024-06-25 DIAGNOSIS — F3181 Bipolar II disorder: Secondary | ICD-10-CM

## 2024-07-06 ENCOUNTER — Other Ambulatory Visit: Payer: Self-pay | Admitting: Family Medicine

## 2024-07-06 DIAGNOSIS — K828 Other specified diseases of gallbladder: Secondary | ICD-10-CM

## 2024-07-11 ENCOUNTER — Inpatient Hospital Stay
Admission: RE | Admit: 2024-07-11 | Discharge: 2024-07-11 | Disposition: A | Source: Ambulatory Visit | Attending: Family Medicine | Admitting: Family Medicine

## 2024-07-11 DIAGNOSIS — K828 Other specified diseases of gallbladder: Secondary | ICD-10-CM

## 2024-07-11 MED ORDER — IOPAMIDOL (ISOVUE-300) INJECTION 61%
100.0000 mL | Freq: Once | INTRAVENOUS | Status: AC | PRN
  Administered 2024-07-11: 100 mL via INTRAVENOUS

## 2024-09-03 ENCOUNTER — Other Ambulatory Visit: Payer: Self-pay | Admitting: General Surgery

## 2024-09-03 NOTE — Progress Notes (Signed)
 Surgical Instructions   Your procedure is scheduled on January 20. Report to [facility] Main Entrance A at 5:30 A.M., then check in with the Admitting office. Any questions or running late day of surgery: call [phone]  Questions prior to your surgery date: call [phone], Monday-Friday, 8am-4pm. If you experience any cold or flu symptoms such as cough, fever, chills, shortness of breath, etc. between now and your scheduled surgery, please notify us  at the above number.     Remember:  Do not eat after midnight the night before your surgery   You may drink clear liquids until 4:30am the morning of your surgery.   Clear liquids allowed are: Water , Non-Citrus Juices (without pulp), Carbonated Beverages, Clear Tea (no milk, honey, etc.), Black Coffee Only (NO MILK, CREAM OR POWDERED CREAMER of any kind), and Gatorade.    Take these medicines the morning of surgery with A SIP OF WATER   cariprazine  (VRAYLAR )  FLUoxetine  (PROZAC )   May take these medicines IF NEEDED: Cetirizine HCl (ZYRTEC ALLERGY PO)    One week prior to surgery, STOP taking any Aspirin (unless otherwise instructed by your surgeon) Aleve , Naproxen , Ibuprofen , Motrin , Advil , Goody's, BC's, all herbal medications, fish oil, and non-prescription vitamins.                     Do NOT Smoke (Tobacco/Vaping) for 24 hours prior to your procedure.  If you use a CPAP at night, you may bring your mask/headgear for your overnight stay.   You will be asked to remove any contacts, glasses, piercing's, hearing aid's, dentures/partials prior to surgery. Please bring cases for these items if needed.    Your surgeon will determine if you are to be admitted or discharged the same day.  Patients discharged the day of surgery will not be allowed to drive home, and someone needs to stay with them for 24 hours.  SURGICAL WAITING ROOM VISITATION Patients may have no more than 2 support people in the waiting area - these visitors  may rotate.   Pre-op nurse will coordinate an appropriate time for 2 ADULT support persons, who may not rotate, to accompany patient in pre-op.  Children under the age of 51 must have an adult with them who is not the patient and must remain in the main waiting area with an adult.  If the patient needs to stay at the hospital during part of their recovery, the visitor guidelines for inpatient rooms apply.  Please refer to the [facility] website for the visitor guidelines for any additional information.   If you received a COVID test during your pre-op visit  it is requested that you wear a mask when out in public, stay away from anyone that may not be feeling well and notify your surgeon if you develop symptoms. If you have been in contact with anyone that has tested positive in the last 10 days please notify you surgeon.      Pre-operative CHG Bathing Instructions   You can play a key role in reducing the risk of infection after surgery. Your skin needs to be as free of germs as possible. You can reduce the number of germs on your skin by washing with CHG (chlorhexidine  gluconate) soap before surgery. CHG is an antiseptic soap that kills germs and continues to kill germs even after washing.   DO NOT use if you have an allergy to chlorhexidine /CHG or antibacterial soaps. If your skin becomes reddened or irritated, stop using the CHG  and notify one of our RNs at [phone].              TAKE A SHOWER THE NIGHT BEFORE SURGERY   Please keep in mind the following:  DO NOT shave, including legs and underarms, 48 hours prior to surgery.   You may shave your face before/day of surgery.  Place clean sheets on your bed the night before surgery Use a clean washcloth (not used since being washed) for shower. DO NOT sleep with pet's night before surgery.  CHG Shower Instructions:  Wash your face and private area with normal soap. If you choose to wash your hair, wash first with your normal  shampoo.  After you use shampoo/soap, rinse your hair and body thoroughly to remove shampoo/soap residue.  Turn the water  OFF and apply half the bottle of CHG soap to a CLEAN washcloth.  Apply CHG soap ONLY FROM YOUR NECK DOWN TO YOUR TOES (washing for 3-5 minutes)  DO NOT use CHG soap on face, private areas, open wounds, or sores.  Pay special attention to the area where your surgery is being performed.  If you are having back surgery, having someone wash your back for you may be helpful. Wait 2 minutes after CHG soap is applied, then you may rinse off the CHG soap.  Pat dry with a clean towel  Put on clean pajamas    Additional instructions for the day of surgery: If you choose, you may shower the morning of surgery with an antibacterial soap.  DO NOT APPLY any lotions, deodorants, cologne, or perfumes.   Do not wear jewelry or makeup Do not wear nail polish, gel polish, artificial nails, or any other type of covering on natural nails (fingers and toes) Do not bring valuables to the hospital. [facility] is not responsible for valuables/personal belongings. Put on clean/comfortable clothes.  Please brush your teeth.  Ask your nurse before applying any prescription medications to the skin.

## 2024-09-04 ENCOUNTER — Other Ambulatory Visit: Payer: Self-pay

## 2024-09-04 ENCOUNTER — Encounter (HOSPITAL_COMMUNITY): Payer: Self-pay

## 2024-09-04 ENCOUNTER — Inpatient Hospital Stay (HOSPITAL_COMMUNITY): Admission: RE | Admit: 2024-09-04 | Discharge: 2024-09-04 | Attending: General Surgery

## 2024-09-04 VITALS — BP 102/61 | HR 53 | Temp 98.6°F | Resp 17 | Ht 62.5 in | Wt 143.0 lb

## 2024-09-04 DIAGNOSIS — Z01812 Encounter for preprocedural laboratory examination: Secondary | ICD-10-CM | POA: Insufficient documentation

## 2024-09-04 DIAGNOSIS — K219 Gastro-esophageal reflux disease without esophagitis: Secondary | ICD-10-CM | POA: Diagnosis not present

## 2024-09-04 DIAGNOSIS — N183 Chronic kidney disease, stage 3 unspecified: Secondary | ICD-10-CM | POA: Diagnosis not present

## 2024-09-04 DIAGNOSIS — Z85820 Personal history of malignant melanoma of skin: Secondary | ICD-10-CM | POA: Diagnosis not present

## 2024-09-04 DIAGNOSIS — K449 Diaphragmatic hernia without obstruction or gangrene: Secondary | ICD-10-CM | POA: Diagnosis not present

## 2024-09-04 DIAGNOSIS — K828 Other specified diseases of gallbladder: Secondary | ICD-10-CM | POA: Insufficient documentation

## 2024-09-04 DIAGNOSIS — F32A Depression, unspecified: Secondary | ICD-10-CM | POA: Diagnosis not present

## 2024-09-04 DIAGNOSIS — R296 Repeated falls: Secondary | ICD-10-CM | POA: Diagnosis not present

## 2024-09-04 DIAGNOSIS — N289 Disorder of kidney and ureter, unspecified: Secondary | ICD-10-CM | POA: Diagnosis not present

## 2024-09-04 DIAGNOSIS — M40209 Unspecified kyphosis, site unspecified: Secondary | ICD-10-CM | POA: Diagnosis not present

## 2024-09-04 DIAGNOSIS — Z01818 Encounter for other preprocedural examination: Secondary | ICD-10-CM

## 2024-09-04 DIAGNOSIS — F419 Anxiety disorder, unspecified: Secondary | ICD-10-CM | POA: Diagnosis not present

## 2024-09-04 DIAGNOSIS — J45909 Unspecified asthma, uncomplicated: Secondary | ICD-10-CM | POA: Insufficient documentation

## 2024-09-04 LAB — CBC
HCT: 38.8 % (ref 36.0–46.0)
Hemoglobin: 13.3 g/dL (ref 12.0–15.0)
MCH: 31.2 pg (ref 26.0–34.0)
MCHC: 34.3 g/dL (ref 30.0–36.0)
MCV: 91.1 fL (ref 80.0–100.0)
Platelets: 263 K/uL (ref 150–400)
RBC: 4.26 MIL/uL (ref 3.87–5.11)
RDW: 13.8 % (ref 11.5–15.5)
WBC: 6.8 K/uL (ref 4.0–10.5)
nRBC: 0 % (ref 0.0–0.2)

## 2024-09-04 LAB — BASIC METABOLIC PANEL WITH GFR
Anion gap: 9 (ref 5–15)
BUN: 13 mg/dL (ref 6–20)
CO2: 27 mmol/L (ref 22–32)
Calcium: 9.5 mg/dL (ref 8.9–10.3)
Chloride: 104 mmol/L (ref 98–111)
Creatinine, Ser: 0.9 mg/dL (ref 0.44–1.00)
GFR, Estimated: >60 mL/min
Glucose, Bld: 92 mg/dL (ref 70–99)
Potassium: 3.8 mmol/L (ref 3.5–5.1)
Sodium: 141 mmol/L (ref 135–145)

## 2024-09-04 NOTE — Progress Notes (Signed)
 PCP -  Montie Pizza Cardiologist - denies current cardiologist- last saw Dr Sheena in 2021 for palpitations- follow up PRN per note  PPM/ICD - denies   Chest x-ray - NA EKG - NA per Anesthesia APP Stress Test - Exercise Tolerance Test- 06/07/17- normal ECHO - 06/07/17 Cardiac Cath - denies  Sleep Study - denies   Fasting Blood Sugar - NA  Last dose of GLP1 agonist-  Pt reports that she takes Liposlim, which she was told is a GLP-1. Last dose 09/02/24. Pt aware to not take this again prior to surgery.    Blood Thinner Instructions: NA Aspirin Instructions:NA  ERAS Protcol - ERAS + ensure per order   COVID TEST-  NA   Anesthesia review: Anesthesia APP notified of pt at PAT appointment d/t needing instructions for Liposlim. Pt also has history of palpitations. Pt denies any palpitations in over a year. Review patient cardiac tests.   Patient denies shortness of breath, fever, cough and chest pain at PAT appointment   All instructions explained to the patient, with a verbal understanding of the material. Patient agrees to go over the instructions while at home for a better understanding.  The opportunity to ask questions was provided.

## 2024-09-04 NOTE — Progress Notes (Signed)
 Surgical Instructions   Your procedure is scheduled on January 20. Report to [facility] Main Entrance A at 7:00A.M., then check in with the Admitting office. Any questions or running late day of surgery: call [phone]  Questions prior to your surgery date: call [phone], Monday-Friday, 8am-4pm. If you experience any cold or flu symptoms such as cough, fever, chills, shortness of breath, etc. between now and your scheduled surgery, please notify us  at the above number.     Remember:  Do not eat after midnight the night before your surgery   You may drink clear liquids until 6:00am the morning of your surgery.   Clear liquids allowed are: Water , Non-Citrus Juices (without pulp), Carbonated Beverages, Clear Tea (no milk, honey, etc.), Black Coffee Only (NO MILK, CREAM OR POWDERED CREAMER of any kind), and Gatorade.  Patient Instructions  The night before surgery:  No food after midnight. ONLY clear liquids after midnight  The day of surgery (if you do NOT have diabetes):  Drink ONE (1) Pre-Surgery Clear Ensure by 6:00AM the morning of surgery. Drink in one sitting. Do not sip.  This drink was given to you during your hospital  pre-op appointment visit.  Nothing else to drink after completing the  Pre-Surgery Clear Ensure.          If you have questions, please contact your surgeons office.     Take these medicines the morning of surgery with A SIP OF WATER   cariprazine  (VRAYLAR )  FLUoxetine  (PROZAC )  levothyroxine (SYNTHROID)   May take these medicines IF NEEDED: Cetirizine HCl (ZYRTEC ALLERGY PO)   DO NOT TAKE Liposlim for 7 days prior to surgery. Your last dose should be 09/02/24.   One week prior to surgery, STOP taking any Aspirin (unless otherwise instructed by your surgeon) Aleve , Naproxen , Ibuprofen , Motrin , Advil , Goody's, BC's, all herbal medications, fish oil, and non-prescription vitamins.                     Do NOT Smoke (Tobacco/Vaping) for 24  hours prior to your procedure.  If you use a CPAP at night, you may bring your mask/headgear for your overnight stay.   You will be asked to remove any contacts, glasses, piercing's, hearing aid's, dentures/partials prior to surgery. Please bring cases for these items if needed.    Your surgeon will determine if you are to be admitted or discharged the same day.  Patients discharged the day of surgery will not be allowed to drive home, and someone needs to stay with them for 24 hours.  SURGICAL WAITING ROOM VISITATION Patients may have no more than 2 support people in the waiting area - these visitors may rotate.   Pre-op nurse will coordinate an appropriate time for 2 ADULT support persons, who may not rotate, to accompany patient in pre-op.  Children under the age of 55 must have an adult with them who is not the patient and must remain in the main waiting area with an adult.  If the patient needs to stay at the hospital during part of their recovery, the visitor guidelines for inpatient rooms apply.  Please refer to the [facility] website for the visitor guidelines for any additional information.   If you received a COVID test during your pre-op visit  it is requested that you wear a mask when out in public, stay away from anyone that may not be feeling well and notify your surgeon if you develop symptoms. If you have been in  contact with anyone that has tested positive in the last 10 days please notify you surgeon.      Pre-operative CHG Bathing Instructions   You can play a key role in reducing the risk of infection after surgery. Your skin needs to be as free of germs as possible. You can reduce the number of germs on your skin by washing with CHG (chlorhexidine  gluconate) soap before surgery. CHG is an antiseptic soap that kills germs and continues to kill germs even after washing.   DO NOT use if you have an allergy to chlorhexidine /CHG or antibacterial soaps. If your skin becomes  reddened or irritated, stop using the CHG and notify one of our RNs at [phone].              TAKE A SHOWER THE NIGHT BEFORE SURGERY   Please keep in mind the following:  DO NOT shave, including legs and underarms, 48 hours prior to surgery.   You may shave your face before/day of surgery.  Place clean sheets on your bed the night before surgery Use a clean washcloth (not used since being washed) for shower. DO NOT sleep with pet's night before surgery.  CHG Shower Instructions:  Wash your face and private area with normal soap. If you choose to wash your hair, wash first with your normal shampoo.  After you use shampoo/soap, rinse your hair and body thoroughly to remove shampoo/soap residue.  Turn the water  OFF and apply half the bottle of CHG soap to a CLEAN washcloth.  Apply CHG soap ONLY FROM YOUR NECK DOWN TO YOUR TOES (washing for 3-5 minutes)  DO NOT use CHG soap on face, private areas, open wounds, or sores.  Pay special attention to the area where your surgery is being performed.  If you are having back surgery, having someone wash your back for you may be helpful. Wait 2 minutes after CHG soap is applied, then you may rinse off the CHG soap.  Pat dry with a clean towel  Put on clean pajamas    Additional instructions for the day of surgery: If you choose, you may shower the morning of surgery with an antibacterial soap.  DO NOT APPLY any lotions, deodorants, cologne, or perfumes.   Do not wear jewelry or makeup Do not wear nail polish, gel polish, artificial nails, or any other type of covering on natural nails (fingers and toes) Do not bring valuables to the hospital. [facility] is not responsible for valuables/personal belongings. Put on clean/comfortable clothes.  Please brush your teeth.  Ask your nurse before applying any prescription medications to the skin.

## 2024-09-05 NOTE — Progress Notes (Signed)
 Anesthesia Chart Review:  Case: [id number] Date/Time: 09/11/24 0845   Procedure: LAPAROSCOPIC CHOLECYSTECTOMY   Anesthesia type: General   Pre-op diagnosis: GALLBLADDER CALCIFICATION   Location: [facility] OR ROOM 10 / [facility] OR   Surgeons: Ebbie Cough, MD       DISCUSSION: Patient is a 54 year old female scheduled for the above procedure.  History includes never smoker, asthma, CKD (stage III), GERD, hiatal hernia, anxiety, depression, kyphosis, frequent falls, migraines, melanoma (left hip 2010), total laparoscopy hysterectomy (12/12/2008).  Previous unremarkable cardiac testing for dyspnea and asymptomatic PVCs. As needed follow-up per Dr. Sheena at 2021 visit. She had a coronary calcium score of 0 per her PCP Dr. Teresa in January 2025. She denied palpitations in > 1 year. She denied chest pain and SOB at PAT RN visit.   She receives lipotropic injections from a compounding pharmacy in [place] for weight loss. Last dose 09/03/2023 and will hold until after surgery.   Anesthesia team to evaluate on the day of surgery.   VS: BP 102/61   Pulse (!) 53   Temp 37 C   Resp 17   Ht 5' 2.5 (1.588 m)   Wt 64.9 kg   SpO2 98%   BMI 25.74 kg/m   PROVIDERS: Teresa Channel, MD is PCP  Sheena Pugh, DO is cardiologist. As needed follow-up at 08/28/2019 visit for asymptomatic PVCs.   LABS: Labs reviewed: Acceptable for surgery. (all labs ordered are listed, but only abnormal results are displayed)  Labs Reviewed  BASIC METABOLIC PANEL WITH GFR  CBC   05/09/2024: TBili 0.8, ALP 102, AST 17, ALT 16 ([facility])  IMAGES: CT Abd 11/19/20205: IMPRESSION: Evaluation for gallbladder wall calcifications is limited secondary to a contrast enhanced examination. There are several foci of high density noted along the gallbladder wall which persist on delayed images. These are favored to reflect mural calcifications. 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   EKG: Last EKG noted  10/22/2022: Sinus rhythm with occasional Premature ventricular complexes Cannot rule out Anterior infarct , age undetermined Abnormal ECG No previous ECGs available Confirmed by Ruthe Cornet [phone]) on 10/22/2022 8:49:26 AM   CV: CT Cardiac Calcium Scoring 09/08/2023: FINDINGS: Coronary Calcium Score:  Left main: 0  Left anterior descending artery: 0 Left circumflex artery: 0 Right coronary artery: 0 Total: 0 Percentile: <1 Pericardium: Normal. Ascending Aorta: Normal caliber. Pulmonary artery: Normal caliber   IMPRESSION:  Coronary calcium score of 0. This was <1 percentile for age-, race-, and sex-matched controls.    Long term monitor 08/12/2019: -The patient wore the monitor for 11 days 4 hours starting July 25, 2019. - Indication: Palpitations - The minimum heart rate was 56 bpm, maximum heart rate was 158 bpm, and average heart rate was 90 bpm. Predominant underlying rhythm was Sinus Rhythm. - Premature atrial complexes were rare (<1.0%) Premature Ventricular complexes occasional (1.2%, 17989) - No no ventricular tachycardia, no supraventricular tachycardia, no pauses, No AV block and no atrial fibrillation present. No patient triggered events or diary events were noted. - Conclusion: This study is remarkable for asymptomatic occasional premature ventricular complexes.   Echo 06/07/2017: Study Conclusions  - Left ventricle: The cavity size was normal. Wall thickness was    normal. Systolic function was normal. The estimated ejection    fraction was in the range of 60% to 65%. Wall motion was normal;    there were no regional wall motion abnormalities. Features are    consistent with a pseudonormal left ventricular filling pattern,  with concomitant abnormal relaxation and increased filling    pressure (grade 2 diastolic dysfunction).    ETT 06/07/2017:  Blood pressure demonstrated a normal response to exercise.   There was no ST segment deviation noted during  stress.   1. Average exercise tolerance.  2. No evidence for ischemia by ST segment analysis.   Past Medical History:  Diagnosis Date   Anxiety    Asthma    CKD (chronic kidney disease), stage III (HCC)    Complication of anesthesia 2010   hard to wake up after colonoscopy, and increased HR   Depression    Frequent falls    GERD (gastroesophageal reflux disease)    no meds   H/O hiatal hernia    Kyphosis    Melanoma (HCC) 2010   left hip   Migraine headache     Past Surgical History:  Procedure Laterality Date   CARPAL TUNNEL RELEASE Bilateral    CYSTOSCOPY  02/25/2012   Procedure: CYSTOSCOPY;  Surgeon: Charlie CHRISTELLA Croak, MD;  Location: [facility] ORS;  Service: Gynecology;  Laterality: N/A;   PARTIAL HYSTERECTOMY     PUBOVAGINAL SLING  02/25/2012   Procedure: CARLOYN GLADE;  Surgeon: Charlie CHRISTELLA Croak, MD;  Location: [facility] ORS;  Service: Gynecology;  Laterality: N/A;   TONSILLECTOMY     TUBAL LIGATION      MEDICATIONS:  Omega-3 Fatty Acids (OMEGA 3 FISH OIL PO)   atorvastatin (LIPITOR) 10 MG tablet   cariprazine  (VRAYLAR ) 1.5 MG capsule   cetirizine (ZYRTEC) 10 MG tablet   cholecalciferol (VITAMIN D3) 25 MCG (1000 UNIT) tablet   estradiol (CLIMARA - DOSED IN MG/24 HR) 0.025 mg/24hr patch   Estradiol 0.75 MG/0.75GM GEL   FLUoxetine  (PROZAC ) 40 MG capsule   levothyroxine (SYNTHROID) 50 MCG tablet   modafinil  (PROVIGIL ) 200 MG tablet   PRESCRIPTION MEDICATION   progesterone (PROMETRIUM) 100 MG capsule   vitamin B-12 (CYANOCOBALAMIN) 1000 MCG tablet   No current facility-administered medications for this encounter.    Isaiah Ruder, PA-C Surgical Short Stay/Anesthesiology [facility] Phone [phone] [facility] Phone [phone] 09/06/2024 6:45 AM

## 2024-09-06 NOTE — Anesthesia Preprocedure Evaluation (Addendum)
"                                    Anesthesia Evaluation  Patient identified by MRN, date of birth, ID band Patient awake    Reviewed: Allergy & Precautions, NPO status , Patient's Chart, lab work & pertinent test results  History of Anesthesia Complications (+) PROLONGED EMERGENCE and history of anesthetic complications (prolonged emergecne w/ cscope remote past)  Airway Mallampati: II  TM Distance: >3 FB Neck ROM: Full    Dental  (+) Teeth Intact, Dental Advisory Given   Pulmonary asthma (no rescue inhaler)  Snores at night, no sleep study    Pulmonary exam normal breath sounds clear to auscultation       Cardiovascular Normal cardiovascular exam Rhythm:Regular Rate:Normal  Previous unremarkable cardiac testing for dyspnea and asymptomatic PVCs. As needed follow-up per Dr. Sheena at 2021 visit. She had a coronary calcium score of 0 per her PCP Dr. Teresa in January 2025. She denied palpitations in > 1 year.   Neuro/Psych  Headaches PSYCHIATRIC DISORDERS Anxiety Depression       GI/Hepatic Neg liver ROS, hiatal hernia,GERD  Controlled,,  Endo/Other  negative endocrine ROS    Renal/GU negative Renal ROS  negative genitourinary   Musculoskeletal negative musculoskeletal ROS (+)    Abdominal   Peds  Hematology negative hematology ROS (+) Hb 13.3, plt 263   Anesthesia Other Findings   Reproductive/Obstetrics negative OB ROS                              Anesthesia Physical Anesthesia Plan  ASA: 2  Anesthesia Plan: General   Post-op Pain Management: Tylenol  PO (pre-op)*, Toradol  IV (intra-op)*, Ketamine  IV* and Dilaudid  IV   Induction: Intravenous  PONV Risk Score and Plan: 4 or greater and Ondansetron , Dexamethasone , Midazolam  and Treatment may vary due to age or medical condition  Airway Management Planned: Oral ETT  Additional Equipment: None  Intra-op Plan:   Post-operative Plan: Extubation in OR  Informed  Consent: I have reviewed the patients History and Physical, chart, labs and discussed the procedure including the risks, benefits and alternatives for the proposed anesthesia with the patient or authorized representative who has indicated his/her understanding and acceptance.     Dental advisory given  Plan Discussed with: CRNA  Anesthesia Plan Comments:          Anesthesia Quick Evaluation  "

## 2024-09-11 ENCOUNTER — Other Ambulatory Visit: Payer: Self-pay

## 2024-09-11 ENCOUNTER — Ambulatory Visit (HOSPITAL_COMMUNITY): Admitting: Registered Nurse

## 2024-09-11 ENCOUNTER — Ambulatory Visit (HOSPITAL_COMMUNITY): Payer: Self-pay | Admitting: Vascular Surgery

## 2024-09-11 ENCOUNTER — Ambulatory Visit (HOSPITAL_COMMUNITY): Admission: RE | Admit: 2024-09-11 | Source: Home / Self Care | Admitting: General Surgery

## 2024-09-11 ENCOUNTER — Encounter (HOSPITAL_COMMUNITY): Payer: Self-pay | Admitting: General Surgery

## 2024-09-11 ENCOUNTER — Encounter (HOSPITAL_COMMUNITY)
Admission: RE | Disposition: A | Discharge status: Home / Self Care | Payer: Self-pay | Source: Home / Self Care | Attending: General Surgery

## 2024-09-11 DIAGNOSIS — K828 Other specified diseases of gallbladder: Secondary | ICD-10-CM

## 2024-09-11 DIAGNOSIS — K449 Diaphragmatic hernia without obstruction or gangrene: Secondary | ICD-10-CM | POA: Insufficient documentation

## 2024-09-11 DIAGNOSIS — K8010 Calculus of gallbladder with chronic cholecystitis without obstruction: Secondary | ICD-10-CM | POA: Diagnosis not present

## 2024-09-11 DIAGNOSIS — K219 Gastro-esophageal reflux disease without esophagitis: Secondary | ICD-10-CM | POA: Diagnosis not present

## 2024-09-11 DIAGNOSIS — J45909 Unspecified asthma, uncomplicated: Secondary | ICD-10-CM | POA: Insufficient documentation

## 2024-09-11 DIAGNOSIS — F418 Other specified anxiety disorders: Secondary | ICD-10-CM | POA: Insufficient documentation

## 2024-09-11 HISTORY — PX: CHOLECYSTECTOMY: SHX55

## 2024-09-11 MED ORDER — SODIUM CHLORIDE 0.9% FLUSH: 3.0000 mL | Freq: Two times a day (BID) | INTRAVENOUS | Status: DC

## 2024-09-11 MED ORDER — ENSURE PRE-SURGERY PO LIQD: 296.0000 mL | Freq: Once | ORAL | Status: DC

## 2024-09-11 MED ORDER — ORAL CARE MOUTH RINSE: 15.0000 mL | Freq: Once | OROMUCOSAL | Status: AC

## 2024-09-11 MED ORDER — BUPIVACAINE-EPINEPHRINE 0.25% -1:200000 IJ SOLN
INTRAMUSCULAR | Status: DC | PRN
  Administered 2024-09-11: 17 mL

## 2024-09-11 MED ORDER — ACETAMINOPHEN 500 MG PO TABS
1000.0000 mg | ORAL_TABLET | ORAL | Status: AC
  Administered 2024-09-11: 1000 mg via ORAL
  Filled 2024-09-11: qty 2

## 2024-09-11 MED ORDER — OXYCODONE HCL 5 MG PO TABS
ORAL_TABLET | ORAL | Status: AC
  Filled 2024-09-11: qty 1

## 2024-09-11 MED ORDER — CIPROFLOXACIN IN D5W 400 MG/200ML IV SOLN
400.0000 mg | INTRAVENOUS | Status: AC
  Administered 2024-09-11: 400 mg via INTRAVENOUS
  Filled 2024-09-11: qty 200

## 2024-09-11 MED ORDER — OXYCODONE HCL 5 MG PO TABS: 5.0000 mg | ORAL_TABLET | ORAL | Status: DC | PRN

## 2024-09-11 MED ORDER — DEXAMETHASONE SOD PHOSPHATE PF 10 MG/ML IJ SOLN
INTRAMUSCULAR | Status: DC | PRN
  Administered 2024-09-11: 10 mg via INTRAVENOUS

## 2024-09-11 MED ORDER — MIDAZOLAM HCL (PF) 2 MG/2ML IJ SOLN
INTRAMUSCULAR | Status: DC | PRN
  Administered 2024-09-11: 2 mg via INTRAVENOUS

## 2024-09-11 MED ORDER — CHLORHEXIDINE GLUCONATE 0.12 % MT SOLN
15.0000 mL | Freq: Once | OROMUCOSAL | Status: AC
  Administered 2024-09-11: 15 mL via OROMUCOSAL
  Filled 2024-09-11: qty 15

## 2024-09-11 MED ORDER — HYDROMORPHONE HCL 1 MG/ML IJ SOLN
0.2500 mg | INTRAMUSCULAR | Status: DC | PRN
  Administered 2024-09-11: 0.5 mg via INTRAVENOUS
  Administered 2024-09-11 (×2): 0.25 mg via INTRAVENOUS

## 2024-09-11 MED ORDER — LIDOCAINE 2% (20 MG/ML) 5 ML SYRINGE
INTRAMUSCULAR | Status: DC | PRN
  Administered 2024-09-11: 60 mg via INTRAVENOUS

## 2024-09-11 MED ORDER — ACETAMINOPHEN 325 MG PO TABS: 650.0000 mg | ORAL_TABLET | ORAL | Status: DC | PRN

## 2024-09-11 MED ORDER — KETOROLAC TROMETHAMINE 30 MG/ML IJ SOLN
INTRAMUSCULAR | Status: AC
  Filled 2024-09-11: qty 1

## 2024-09-11 MED ORDER — HYDROMORPHONE HCL 1 MG/ML IJ SOLN
INTRAMUSCULAR | Status: AC
  Filled 2024-09-11: qty 1

## 2024-09-11 MED ORDER — INDOCYANINE GREEN 25 MG IJ SOLR
INTRAMUSCULAR | Status: DC | PRN
  Administered 2024-09-11: 1.25 mg via INTRAVENOUS

## 2024-09-11 MED ORDER — AMISULPRIDE (ANTIEMETIC) 5 MG/2ML IV SOLN: 10.0000 mg | Freq: Once | INTRAVENOUS | Status: DC | PRN

## 2024-09-11 MED ORDER — FENTANYL CITRATE (PF) 250 MCG/5ML IJ SOLN
INTRAMUSCULAR | Status: DC | PRN
  Administered 2024-09-11: 50 ug via INTRAVENOUS

## 2024-09-11 MED ORDER — ONDANSETRON HCL 4 MG/2ML IJ SOLN: 4.0000 mg | Freq: Once | INTRAMUSCULAR | Status: DC | PRN

## 2024-09-11 MED ORDER — DEXAMETHASONE SOD PHOSPHATE PF 10 MG/ML IJ SOLN
INTRAMUSCULAR | Status: AC
  Filled 2024-09-11: qty 1

## 2024-09-11 MED ORDER — SODIUM CHLORIDE 0.9% FLUSH: 3.0000 mL | INTRAVENOUS | Status: DC | PRN

## 2024-09-11 MED ORDER — FENTANYL CITRATE (PF) 100 MCG/2ML IJ SOLN
INTRAMUSCULAR | Status: AC
  Filled 2024-09-11: qty 2

## 2024-09-11 MED ORDER — ONDANSETRON HCL 4 MG/2ML IJ SOLN
INTRAMUSCULAR | Status: AC
  Filled 2024-09-11: qty 2

## 2024-09-11 MED ORDER — OXYCODONE HCL 5 MG/5ML PO SOLN: 5.0000 mg | Freq: Once | ORAL | Status: AC | PRN

## 2024-09-11 MED ORDER — CHLORHEXIDINE GLUCONATE CLOTH 2 % EX PADS: 6.0000 | MEDICATED_PAD | Freq: Once | CUTANEOUS | Status: DC

## 2024-09-11 MED ORDER — PHENYLEPHRINE 80 MCG/ML (10ML) SYRINGE FOR IV PUSH (FOR BLOOD PRESSURE SUPPORT)
PREFILLED_SYRINGE | INTRAVENOUS | Status: DC | PRN
  Administered 2024-09-11: 160 ug via INTRAVENOUS

## 2024-09-11 MED ORDER — ROCURONIUM BROMIDE 10 MG/ML (PF) SYRINGE
PREFILLED_SYRINGE | INTRAVENOUS | Status: AC
  Filled 2024-09-11: qty 10

## 2024-09-11 MED ORDER — 0.9 % SODIUM CHLORIDE (POUR BTL) OPTIME
TOPICAL | Status: DC | PRN
  Administered 2024-09-11: 1000 mL

## 2024-09-11 MED ORDER — PHENYLEPHRINE 80 MCG/ML (10ML) SYRINGE FOR IV PUSH (FOR BLOOD PRESSURE SUPPORT)
PREFILLED_SYRINGE | INTRAVENOUS | Status: AC
  Filled 2024-09-11: qty 10

## 2024-09-11 MED ORDER — OXYCODONE HCL 5 MG PO TABS: 5.0000 mg | ORAL_TABLET | Freq: Four times a day (QID) | ORAL | 0 refills | Status: AC | PRN

## 2024-09-11 MED ORDER — LIDOCAINE 2% (20 MG/ML) 5 ML SYRINGE
INTRAMUSCULAR | Status: AC
  Filled 2024-09-11: qty 5

## 2024-09-11 MED ORDER — LACTATED RINGERS IV SOLN: INTRAVENOUS | Status: DC

## 2024-09-11 MED ORDER — MIDAZOLAM HCL 2 MG/2ML IJ SOLN
INTRAMUSCULAR | Status: AC
  Filled 2024-09-11: qty 2

## 2024-09-11 MED ORDER — KETAMINE HCL 50 MG/5ML IJ SOSY
PREFILLED_SYRINGE | INTRAMUSCULAR | Status: AC
  Filled 2024-09-11: qty 5

## 2024-09-11 MED ORDER — SODIUM CHLORIDE 0.9 % IR SOLN
Status: DC | PRN
  Administered 2024-09-11: 1000 mL

## 2024-09-11 MED ORDER — SODIUM CHLORIDE 0.9 % IV SOLN: 250.0000 mL | INTRAVENOUS | Status: DC | PRN

## 2024-09-11 MED ORDER — BUPIVACAINE-EPINEPHRINE (PF) 0.25% -1:200000 IJ SOLN
INTRAMUSCULAR | Status: AC
  Filled 2024-09-11: qty 30

## 2024-09-11 MED ORDER — ACETAMINOPHEN 650 MG RE SUPP: 650.0000 mg | RECTAL | Status: DC | PRN

## 2024-09-11 MED ORDER — KETAMINE HCL 10 MG/ML IJ SOLN
INTRAMUSCULAR | Status: DC | PRN
  Administered 2024-09-11: 30 mg via INTRAVENOUS

## 2024-09-11 MED ORDER — ONDANSETRON HCL 4 MG/2ML IJ SOLN
INTRAMUSCULAR | Status: DC | PRN
  Administered 2024-09-11: 4 mg via INTRAVENOUS

## 2024-09-11 MED ORDER — OXYCODONE HCL 5 MG PO TABS
5.0000 mg | ORAL_TABLET | Freq: Once | ORAL | Status: AC | PRN
  Administered 2024-09-11: 5 mg via ORAL

## 2024-09-11 MED ORDER — ROCURONIUM BROMIDE 10 MG/ML (PF) SYRINGE
PREFILLED_SYRINGE | INTRAVENOUS | Status: DC | PRN
  Administered 2024-09-11: 70 mg via INTRAVENOUS

## 2024-09-11 MED ORDER — KETOROLAC TROMETHAMINE 30 MG/ML IJ SOLN
30.0000 mg | Freq: Once | INTRAMUSCULAR | Status: AC | PRN
  Administered 2024-09-11: 30 mg via INTRAVENOUS

## 2024-09-11 MED ORDER — MEPERIDINE HCL 25 MG/ML IJ SOLN: 6.2500 mg | INTRAMUSCULAR | Status: DC | PRN

## 2024-09-11 MED ORDER — PROPOFOL 10 MG/ML IV BOLUS
INTRAVENOUS | Status: DC | PRN
  Administered 2024-09-11: 200 mg via INTRAVENOUS

## 2024-09-11 MED ORDER — SUGAMMADEX SODIUM 200 MG/2ML IV SOLN
INTRAVENOUS | Status: DC | PRN
  Administered 2024-09-11: 200 mg via INTRAVENOUS

## 2024-09-11 NOTE — Interval H&P Note (Signed)
 History and Physical Interval Note:  09/11/2024 8:43 AM  Dana [name]  has presented today for surgery, with the diagnosis of GALLBLADDER CALCIFICATION.  The various methods of treatment have been discussed with the patient and family. After consideration of risks, benefits and other options for treatment, the patient has consented to  Procedures: LAPAROSCOPIC CHOLECYSTECTOMY (N/A) as a surgical intervention.  The patient's history has been reviewed, patient examined, no change in status, stable for surgery.  I have reviewed the patient's chart and labs.  Questions were answered to the patient's satisfaction.     Donnice Bury

## 2024-09-11 NOTE — Op Note (Signed)
 Preoperative diagnosis: RUQ pain, gallbladder calcifications Postoperative diagnosis: Same as above Procedure: Laparoscopic cholecystectomy Surgeon: Dr. Adina Bury Anesthesia: General Estimated blood loss: Minimal Complications: None Drains: None Specimens: Gallbladder and contents to pathology Special count was correct completion Disposition recovery stable condition   Indications: 54 year old female referred by Dr. Teresa for evaluation for a possible porcelain gallbladder. She does have a history of epigastric pain after she eats. This is associated with nausea. She has previously been followed for gallbladder polyps by gastroenterology. She underwent an evaluation with an abdominal ultrasound. This was in for follow-up from the prior polyps. On an ultrasound she was noted to have no cholelithiasis. There is calcification near the gallbladder fundus. The common bile duct was 4.7 mm. She then underwent a CT scan with the results showing several foci of high density along the gallbladder wall which are favored to represent mural calcifications. This is all favored to appear to be developing porcelain gallbladder but are likely just selective mucosal calcifications. We discussed laparoscopic cholecystectomy   Procedure: After informed consent was obtained she was taken to the operating room.  She was already on antibiotics.  SCDs were placed.  She was placed under general anesthesia without complication.  She was prepped and draped in standard sterile surgical fashion.  A surgical timeout was then performed.   ICG dye had been given prior to the procedure.   I infiltrated Marcaine  below the umbilicus and made a vertical incision.  I carried this to the fascia.  I grasped the fascia and incised it sharply.  I entered the peritoneum bluntly without injury.  I placed a 0 Vicryl pursestring suture through the fascia and inserted a Hassan trocar.  I then insufflated the abdomen to 15 mmHg pressure.   I then inserted 3 additional 5 mm trocars in the epigastrium and upper abdomen without difficulty.  Her gallbladder was retracted cephalad.   I was then able to dissect the triangle.  I clearly obtained the critical view of safety observing 2 structures entering the gallbladder.  I took the gallbladder off of the liver bed halfway up the gallbladder.  I then decided to clip and divide the artery. I confirmed this with green dye.  This left me with my cystic duct. I then clipped the duct and divided it.  I then completed taking the gallbladder off the liver.  I placed this in a retrieval bag and removed from the abdomen.  Hemostasis was obtained on the liver.  I irrigated until this was clear.  I then removed my Hassan trocar and tied my pursestring down.  I used the suture passer device to place an additional suture to completely obliterate the umbilical defect.  I removed the remaining trocars and desufflated the abdomen.  I then closed this with 4-0 Monocryl and glue.  She tolerated this well was extubated and transferred to recovery stable.

## 2024-09-11 NOTE — Anesthesia Postprocedure Evaluation (Signed)
"   Anesthesia Post Note  Patient: Dana [name]  Procedure(s) Performed: LAPAROSCOPIC CHOLECYSTECTOMY W/ICG DYE (Abdomen)     Patient location during evaluation: PACU Anesthesia Type: General Level of consciousness: awake and alert, oriented and patient cooperative Pain management: pain level controlled Vital Signs Assessment: post-procedure vital signs reviewed and stable Respiratory status: spontaneous breathing, nonlabored ventilation and respiratory function stable Cardiovascular status: blood pressure returned to baseline and stable Postop Assessment: no apparent nausea or vomiting Anesthetic complications: no   There were no known notable events for this encounter.  Last Vitals:  Vitals:   09/11/24 0952 09/11/24 1000  BP: (!) 118/58 105/65  Pulse: 75 66  Resp: 13 18  Temp: 36.4 C   SpO2: 99% 94%    Last Pain:  Vitals:   09/11/24 1015  TempSrc:   PainSc: 7                  Almarie HERO Chandelle Harkey      "

## 2024-09-11 NOTE — H&P (Signed)
 " 54 year old female referred by Dr. Teresa for evaluation for a possible porcelain gallbladder. She does have a history of epigastric pain after she eats. This is associated with nausea. She has previously been followed for gallbladder polyps by gastroenterology. She underwent an evaluation with an abdominal ultrasound. This was in for follow-up from the prior polyps. On an ultrasound she was noted to have no cholelithiasis. There is calcification near the gallbladder fundus. The common bile duct was 4.7 mm. She then underwent a CT scan with the results showing several foci of high density along the gallbladder wall which are favored to represent mural calcifications. This is all favored to appear to be developing porcelain gallbladder but are likely just selective mucosal calcifications. She is here today to discuss those options.  Review of Systems: A complete review of systems was obtained from the patient. I have reviewed this information and discussed as appropriate with the patient. See HPI as well for other ROS.  Review of Systems  Constitutional: Positive for chills.  Gastrointestinal: Positive for diarrhea, heartburn and nausea.  Psychiatric/Behavioral: The patient is nervous/anxious.  All other systems reviewed and are negative.  Medical History: Past Medical History:  Diagnosis Date  Anxiety  Chronic kidney disease  GERD (gastroesophageal reflux disease)   Past Surgical History:  Procedure Laterality Date  HYSTERECTOMY   Allergies  Allergen Reactions  Penicillins Hives and Rash   Current Outpatient Medications on File Prior to Visit  Medication Sig Dispense Refill  cetirizine (ZYRTEC) 10 MG tablet Take by mouth  cholecalciferol (VITAMIN D3) 1000 unit tablet Take 1,000 Units by mouth once daily  cyanocobalamin (VITAMIN B12) 1000 MCG tablet Take 1,000 mcg by mouth once daily  estradiol (DOTTI) patch 0.025 mg/24 hr Apply 1 patch twice a week by transdermal route.  FLUoxetine   (PROZAC ) 40 MG capsule Take 40 mg by mouth once daily  levothyroxine (SYNTHROID) 50 MCG tablet  modafiniL  (PROVIGIL ) 200 MG tablet Take 200 mg by mouth once daily  progesterone (PROMETRIUM) 100 MG capsule Take 100 mg by mouth once daily  VRAYLAR  1.5 mg capsules Take 1.5 mg by mouth once daily   Family History  Problem Relation Age of Onset  Obesity Mother  Hyperlipidemia (Elevated cholesterol) Mother  Obesity Brother    Social History   Tobacco Use  Smoking Status Never  Smokeless Tobacco Never  Marital status: Unknown  Tobacco Use  Smoking status: Never  Smokeless tobacco: Never  Vaping Use  Vaping status: Never Used  Substance and Sexual Activity  Alcohol use: Yes  Alcohol/week: 4.0 - 14.0 standard drinks of alcohol  Types: 4 - 14 Standard drinks or equivalent per week  Drug use: Never   Objective:   Vitals:  Physical Exam Vitals reviewed.  Constitutional:  Appearance: Normal appearance.  Eyes:  General: No scleral icterus. Abdominal:  General: There is no distension.  Palpations: Abdomen is soft.  Tenderness: There is no abdominal tenderness.  Neurological:  Mental Status: She is alert.    Assessment and Plan:   Galbladder calcifications  She appears to have selective mucosal calcifications. She may very well have symptoms as well. We discussed risk of calcs with atypia/cancer although may very well be benign. Discussed observation vs surgery. Will plan to do surgery. I discussed the procedure in detail. We discussed the risks and benefits of a laparoscopic cholecystectomy and possible cholangiogram including, but not limited to bleeding, infection, injury to surrounding structures such as the intestine or liver, bile leak, retained gallstones, need to  convert to an open procedure, prolonged diarrhea, blood clots such as DVT, common bile duct injury, anesthesia risks, and possible need for additional procedures. We also discussed small risk of subtotal  cholecystectomy. The likelihood of improvement in symptoms and return to the patient's normal status is good. We discussed the typical post-operative recovery course.  "

## 2024-09-11 NOTE — Transfer of Care (Signed)
 Immediate Anesthesia Transfer of Care Note  Patient: Dana [name]  Procedure(s) Performed: LAPAROSCOPIC CHOLECYSTECTOMY W/ICG DYE (Abdomen)  Patient Location: PACU  Anesthesia Type:General  Level of Consciousness: drowsy and patient cooperative  Airway & Oxygen Therapy: Patient connected to face mask oxygen  Post-op Assessment: Report given to RN and Post -op Vital signs reviewed and stable  Post vital signs: Reviewed and stable  Last Vitals:  Vitals Value Taken Time  BP 118/58 09/11/24 09:52  Temp    Pulse 75 09/11/24 09:52  Resp 13 09/11/24 09:52  SpO2 99 % 09/11/24 09:52  Vitals shown include unfiled device data.  Last Pain:  Vitals:   09/11/24 0706  TempSrc:   PainSc: 0-No pain         Complications: There were no known notable events for this encounter.

## 2024-09-11 NOTE — Anesthesia Procedure Notes (Signed)
 Procedure Name: Intubation Date/Time: 09/11/2024 9:03 AM  Performed by: Christopher Comings, CRNAPre-anesthesia Checklist: Patient identified, Emergency Drugs available, Suction available and Patient being monitored Patient Re-evaluated:Patient Re-evaluated prior to induction Oxygen Delivery Method: Circle system utilized Preoxygenation: Pre-oxygenation with 100% oxygen Induction Type: IV induction Ventilation: Mask ventilation without difficulty Laryngoscope Size: Mac and 4 Grade View: Grade I Tube type: Oral Tube size: 7.0 mm Number of attempts: 1 Airway Equipment and Method: Stylet and Oral airway Placement Confirmation: ETT inserted through vocal cords under direct vision, positive ETCO2 and breath sounds checked- equal and bilateral Secured at: 22 cm Tube secured with: Tape Dental Injury: Teeth and Oropharynx as per pre-operative assessment

## 2024-09-11 NOTE — Discharge Instructions (Signed)
 [facility] LAPAROSCOPIC SURGERY: POST OP INSTRUCTIONS  Always review your discharge instruction sheet given to you by the facility where your surgery was performed. IF YOU HAVE DISABILITY OR FAMILY LEAVE FORMS, YOU MUST BRING THEM TO THE OFFICE FOR PROCESSING.   DO NOT GIVE THEM TO YOUR DOCTOR.  A prescription for pain medication may be given to you upon discharge.  Take your pain medication as prescribed, if needed.  If narcotic pain medicine is not needed, then you may take acetaminophen  (Tylenol ), naprosyn (Alleve), or ibuprofen  (Advil ) as needed. Take your usually prescribed medications unless otherwise directed. If you need a refill on your pain medication, please contact your pharmacy.  They will contact our office to request authorization. Prescriptions will not be filled after 5pm or on week-ends. You should follow a light diet the first few days after arrival home, such as soup and crackers, etc.  Be sure to include lots of fluids daily. Most patients will experience some swelling and bruising in the area of the incisions.  Ice packs will help.  Swelling and bruising can take several days to resolve.  It is common to experience some constipation if taking pain medication after surgery.  Increasing fluid intake and taking a stool softener (such as Colace) will usually help or prevent this problem from occurring.  A mild laxative (Milk of Magnesia or Miralax) should be taken according to package instructions if there are no bowel movements after 48 hours. Unless discharge instructions indicate otherwise, you may remove your bandages 48 hours after surgery, and you may shower at that time.  You may have steri-strips (small skin tapes) in place directly over the incision.  These strips should be left on the skin for 7-10 days.  If your surgeon used skin glue on the incision, you may shower in 24 hours.  The glue will flake off over the next  2-3 weeks.  Any sutures or staples will be removed at the office during your follow-up visit. ACTIVITIES:  You may resume regular (light) daily activities beginning the next day--such as daily self-care, walking, climbing stairs--gradually increasing activities as tolerated.  You may have sexual intercourse when it is comfortable.  Refrain from any heavy lifting or straining until approved by your doctor. You may drive when you are no longer taking prescription pain medication, you can comfortably wear a seatbelt, and you can safely maneuver your car and apply brakes. RETURN TO WORK:  __________________________________________________________ Dana [name] should see your doctor in the office for a follow-up appointment approximately 2-3 weeks after your surgery.  Make sure that you call for this appointment within a day or two after you arrive home to insure a convenient appointment time. OTHER INSTRUCTIONS: __________________________________________________________________________________________________________________________ __________________________________________________________________________________________________________________________ WHEN TO CALL YOUR DOCTOR: Fever over 101.0 Inability to urinate Continued bleeding from incision. Increased pain, redness, or drainage from the incision. Increasing abdominal pain  The clinic staff is available to answer your questions during regular business hours.  Please don't hesitate to call and ask to speak to one of the nurses for clinical concerns.  If you have a medical emergency, go to the nearest emergency room or call 911.  A surgeon from [facility] is always on call at the hospital. [address], [address], [place], [place]  [address] ? [address] [address], [place], [place]   [address] [phone] ? [phone] ? FAX [phone] Web site: [email or web address]

## 2024-09-12 ENCOUNTER — Encounter (HOSPITAL_COMMUNITY): Payer: Self-pay | Admitting: General Surgery

## 2024-09-12 LAB — SURGICAL PATHOLOGY

## 2025-04-15 ENCOUNTER — Ambulatory Visit: Admitting: Psychiatry
